# Patient Record
Sex: Male | Born: 1970 | Race: Black or African American | Hispanic: No | Smoking: Current some day smoker
Health system: Southern US, Community
[De-identification: ages and names within clinical notes are randomized; demographics above are authoritative.]

## PROBLEM LIST (undated history)

## (undated) DIAGNOSIS — I10 Essential (primary) hypertension: Secondary | ICD-10-CM

## (undated) DIAGNOSIS — I509 Heart failure, unspecified: Secondary | ICD-10-CM

## (undated) HISTORY — PX: ROTATOR CUFF REPAIR: SHX139

## (undated) HISTORY — PX: CHEST TUBE INSERTION: SHX231

---

## 2000-08-05 ENCOUNTER — Encounter: Payer: Self-pay | Admitting: Family Medicine

## 2000-08-05 ENCOUNTER — Encounter: Admission: RE | Admit: 2000-08-05 | Discharge: 2000-08-05 | Payer: Self-pay | Admitting: Family Medicine

## 2009-10-03 ENCOUNTER — Ambulatory Visit: Payer: Self-pay | Admitting: Internal Medicine

## 2009-10-03 DIAGNOSIS — J4489 Other specified chronic obstructive pulmonary disease: Secondary | ICD-10-CM | POA: Insufficient documentation

## 2009-10-03 DIAGNOSIS — J449 Chronic obstructive pulmonary disease, unspecified: Secondary | ICD-10-CM

## 2009-10-03 DIAGNOSIS — I10 Essential (primary) hypertension: Secondary | ICD-10-CM | POA: Insufficient documentation

## 2009-10-03 DIAGNOSIS — R0989 Other specified symptoms and signs involving the circulatory and respiratory systems: Secondary | ICD-10-CM

## 2009-10-03 DIAGNOSIS — R0609 Other forms of dyspnea: Secondary | ICD-10-CM | POA: Insufficient documentation

## 2009-10-10 ENCOUNTER — Encounter: Payer: Self-pay | Admitting: Internal Medicine

## 2009-10-15 LAB — CONVERTED CEMR LAB
BUN: 11 mg/dL (ref 6–23)
Basophils Absolute: 0 10*3/uL (ref 0.0–0.1)
Basophils Relative: 0.4 % (ref 0.0–3.0)
CO2: 29 meq/L (ref 19–32)
Calcium: 9.5 mg/dL (ref 8.4–10.5)
Chloride: 103 meq/L (ref 96–112)
Creatinine, Ser: 1.3 mg/dL (ref 0.4–1.5)
Eosinophils Absolute: 0.2 10*3/uL (ref 0.0–0.7)
Eosinophils Relative: 2.2 % (ref 0.0–5.0)
GFR calc non Af Amer: 78.15 mL/min (ref >60)
Glucose, Bld: 91 mg/dL (ref 70–99)
HCT: 42.6 % (ref 39.0–52.0)
Hemoglobin: 14.6 g/dL (ref 13.0–17.0)
Lymphocytes Relative: 20.8 % (ref 12.0–46.0)
Lymphs Abs: 2.3 10*3/uL (ref 0.7–4.0)
MCHC: 34.4 g/dL (ref 30.0–36.0)
MCV: 83.3 fL (ref 78.0–100.0)
Monocytes Absolute: 0.8 10*3/uL (ref 0.1–1.0)
Monocytes Relative: 7.5 % (ref 3.0–12.0)
Neutro Abs: 7.6 10*3/uL (ref 1.4–7.7)
Neutrophils Relative %: 69.1 % (ref 43.0–77.0)
Platelets: 261 10*3/uL (ref 150.0–400.0)
Potassium: 4 meq/L (ref 3.5–5.1)
Pro B Natriuretic peptide (BNP): 16 pg/mL (ref 0.0–100.0)
RBC: 5.12 M/uL (ref 4.22–5.81)
RDW: 13.6 % (ref 11.5–14.6)
Sodium: 141 meq/L (ref 135–145)
TSH: 1.43 microintl units/mL (ref 0.35–5.50)
WBC: 10.9 10*3/uL — ABNORMAL HIGH (ref 4.5–10.5)

## 2009-11-15 ENCOUNTER — Ambulatory Visit: Payer: Self-pay | Admitting: Internal Medicine

## 2009-11-16 ENCOUNTER — Telehealth: Payer: Self-pay | Admitting: Internal Medicine

## 2010-07-16 NOTE — Assessment & Plan Note (Signed)
Summary: Pulmonary consultation ? copd   Visit Type:  Initial Consult Copy to:  Dr. Renaye Rakers Primary Provider/Referring Provider:  Dr. Renaye Rakers  CC:  COPD.  History of Present Illness: 40 yobm smoker with doe x 1990's seen at Dr Tedra [place] request for ? COPD based office spirometry first week in April.  October 03, 2009 cc doe x indolent onset  mildly progressive x 15y but to point now where can't mow back yard and up [place] a problem and wakes up with congested cough most ams with minimal and transient sputum production. some better with albuterol.   Pt denies any significant sore throat, dysphagia, itching, sneezing,  nasal congestion or excess secretions,  fever, chills, sweats, unintended wt loss, pleuritic or exertional cp, hempoptysis,   orthopnea pnd or leg swelling Pt also denies any obvious fluctuation in symptoms with weather or environmental change or other alleviating or aggravating factors.       Current Medications (verified): 1)  Albuterol Sulfate (2.5 Mg/11ml) 0.083% Nebu (Albuterol Sulfate) .Marland Kitchen.. 1 Vial in Nebulizer Once Daily 2)  Tribenzor (? Strength) .Marland Kitchen.. 1 Once Daily  Allergies (verified): 1)  ! Pcn  Past History:  Past Medical History: Hypertension COPD by office spirometry April 2011     - PFT's ordered October 03, 2009   Family History: Negative for respiratory diseases or atopy   Social History: Married Current smoker since age 55.  Smokes 1/4 ppd. ETOH on the [employer]  Lives with Mother  Review of Systems       The patient complains of shortness of breath with activity, shortness of breath at rest, and productive cough.  The patient denies non-productive cough, coughing up blood, chest pain, irregular heartbeats, acid heartburn, indigestion, loss of appetite, weight change, abdominal pain, difficulty swallowing, sore throat, tooth/dental problems, headaches, nasal congestion/difficulty breathing through nose, sneezing, itching, ear ache, anxiety,  depression, hand/feet swelling, joint stiffness or pain, rash, change in color of mucus, and fever.    Vital Signs:  Patient profile:   40 year old male Height:      73 inches Weight:      243.25 pounds BMI:     32.21 O2 Sat:      95 % on Room air Temp:     97.9 degrees F oral Pulse rate:   95 / minute BP sitting:   122 / 78  (left arm)  Vitals Entered By: Vernie Murders (October 03, 2009 11:51 AM)  O2 Flow:  Room air  Physical Exam  Additional Exam:  amb pleasant bm nad wt 245 October 03, 2009 > 243 October 03, 2009  HEENT: nl dentition, turbinates, and orophanx. Nl external ear canals without cough reflex NECK :  without JVD/Nodes/TM/ nl carotid upstrokes bilaterally LUNGS: no acc muscle use, clear to A and P bilaterally without cough on insp or exp maneuvers CV:  RRR  no s3 or murmur or increase in P2, no edema  ABD:  soft and nontender with nl excursion in the supine position. No bruits or organomegaly, bowel sounds nl MS:  warm without deformities, calf tenderness, cyanosis or clubbing SKIN: warm and dry without lesions   NEURO:  alert, approp, no deficits     Sodium                    141 mEq/L                   135-145   Potassium  4.0 mEq/L                   3.5-5.1   Chloride                  103 mEq/L                   96-112   Carbon Dioxide            29 mEq/L                    19-32   Glucose                   91 mg/dL                    09-81   BUN                       11 mg/dL                    1-91   Creatinine                1.3 mg/dL                   4.7-8.2   Calcium                   9.5 mg/dL                   9.5-62.1   GFR                       78.15 mL/min                >60  Tests: (2) CBC Platelet w/Diff (CBCD)   White Cell Count     [H]  10.9 K/uL                   4.5-10.5   Red Cell Count            5.12 Mil/uL                 4.22-5.81   Hemoglobin                14.6 g/dL                   30.8-65.7   Hematocrit                 42.6 %                      39.0-52.0   MCV                       83.3 fl                     78.0-100.0   MCHC                      34.4 g/dL                   84.6-96.2   RDW                       13.6 %  11.5-14.6   Platelet Count            261.0 K/uL                  150.0-400.0   Neutrophil %              69.1 %                      43.0-77.0   Lymphocyte %              20.8 %                      12.0-46.0   Monocyte %                7.5 %                       3.0-12.0   Eosinophils%              2.2 %                       0.0-5.0   Basophils %               0.4 %                       0.0-3.0   Neutrophill Absolute      7.6 K/uL                    1.4-7.7   Lymphocyte Absolute       2.3 K/uL                    0.7-4.0   Monocyte Absolute         0.8 K/uL                    0.1-1.0  Eosinophils, Absolute                             0.2 K/uL                    0.0-0.7   Basophils Absolute        0.0 K/uL                    0.0-0.1  Tests: (3) TSH (TSH)   FastTSH                   1.43 uIU/mL                 0.35-5.50  Tests: (4) B-Type Natiuretic Peptide (BNPR)  B-Type Natriuetic Peptide                             16.0 pg/mL                  0.0-100.0  CXR  Procedure date:  10/03/2009  Findings:      Findings: Trachea is midline.  Heart size normal.  Mild biapical pleural thickening.  Lungs are otherwise clear.  Added density projecting over the posterior aspect of the lower thoracic spine, on the lateral view, is felt to represent superimposition of osseous structures, as there does not appear be a correlate on the  frontal view.  No pleural fluid.   IMPRESSION: No acute findings.    Impression & Recommendations:  Problem # 1:  COPD UNSPECIFIED (ICD-496) By initial office screen,  but clinically quite mild, needs  f/u pft's    DDX of  difficult airways managment all start with A and  include Adherence, Ace Inhibitors, Acid Reflux, Active  Sinus Disease, Alpha 1 Antitripsin deficiency, Anxiety masquerading as Airways dz,  ABPA,  allergy(esp in young), Aspiration (esp in elderly), Adverse effects of DPI,  Active smokers, plus one B  = Beta blocker use.Marland Kitchen    Active smoking appear to be the most likely suspect here....   I took this opportunity to educate the patient regarding the consequences of smoking in airway disorders based on all the data we have from the multiple national lung health studies indicating that smoking cessation, not choice of inhalers or physicians, is the most important aspect of care.    Medications Added to Medication List This Visit: 1)  Albuterol Sulfate (2.5 Mg/74ml) 0.083% Nebu (Albuterol sulfate) .Marland Kitchen.. 1 vial in nebulizer once daily 2)  Tribenzor (? Strength)  .Marland Kitchen.. 1 once daily  Other Orders: TLB-BMP (Basic Metabolic Panel-BMET) (80048-METABOL) TLB-CBC Platelet - w/Differential (85025-CBCD) TLB-TSH (Thyroid Stimulating Hormone) (84443-TSH) TLB-BNP (B-Natriuretic Peptide) (83880-BNPR) T-2 View CXR (71020TC) Consultation Level IV (04540)  Patient Instructions: 1)  Please schedule a follow-up appointment in 6 weeks, sooner if needed with PFT's on return 2)  Committ to quit smoking now

## 2010-07-16 NOTE — Progress Notes (Signed)
Summary: nos appt  Phone Note Call from Patient   Caller: [email or web address]  Call For: Zachary Davidson Summary of Call: LMTCB x2 to rsc nos from 6/2. Initial call taken by: Darletta Moll,  November 16, 2009 3:28 PM

## 2010-07-16 NOTE — Letter (Signed)
Summary: [facility] Results Follow Up Letter  [facility]  [address]   [place], [place] [address]   Phone: [phone]  Fax: [phone]    10/10/2009 MRN: [id number]  Zachary Davidson [address] [place], [place]  [address]  Dear Mr. CREQUE,  We have received the results from your recent tests and have been unable to contact you.  Please call our office at [phone] so that Dr. Sherene Sires or his nurse may review the results with you.    Thank you,  Nature conservation officer Pulmonary Division

## 2010-07-16 NOTE — Miscellaneous (Signed)
Summary: Orders Update  Clinical Lists Changes  Orders: Added new Service order of No Show NS50 (NS50) - Signed 

## 2010-08-07 ENCOUNTER — Emergency Department (HOSPITAL_COMMUNITY): Payer: PRIVATE HEALTH INSURANCE

## 2010-08-07 ENCOUNTER — Emergency Department (HOSPITAL_COMMUNITY)
Admission: EM | Admit: 2010-08-07 | Discharge: 2010-08-07 | Disposition: A | Discharge status: Home / Self Care | Payer: PRIVATE HEALTH INSURANCE | Attending: Emergency Medicine | Admitting: Emergency Medicine

## 2010-08-07 DIAGNOSIS — R0609 Other forms of dyspnea: Secondary | ICD-10-CM | POA: Insufficient documentation

## 2010-08-07 DIAGNOSIS — R5381 Other malaise: Secondary | ICD-10-CM | POA: Insufficient documentation

## 2010-08-07 DIAGNOSIS — R059 Cough, unspecified: Secondary | ICD-10-CM | POA: Insufficient documentation

## 2010-08-07 DIAGNOSIS — J189 Pneumonia, unspecified organism: Secondary | ICD-10-CM | POA: Insufficient documentation

## 2010-08-07 DIAGNOSIS — R0789 Other chest pain: Secondary | ICD-10-CM | POA: Insufficient documentation

## 2010-08-07 DIAGNOSIS — R0989 Other specified symptoms and signs involving the circulatory and respiratory systems: Secondary | ICD-10-CM | POA: Insufficient documentation

## 2010-08-07 DIAGNOSIS — R05 Cough: Secondary | ICD-10-CM | POA: Insufficient documentation

## 2010-08-07 DIAGNOSIS — IMO0001 Reserved for inherently not codable concepts without codable children: Secondary | ICD-10-CM | POA: Insufficient documentation

## 2010-08-14 ENCOUNTER — Emergency Department (HOSPITAL_COMMUNITY): Payer: PRIVATE HEALTH INSURANCE

## 2010-08-14 ENCOUNTER — Inpatient Hospital Stay (HOSPITAL_COMMUNITY)
Admission: EM | Admit: 2010-08-14 | Discharge: 2010-08-26 | DRG: 166 | Disposition: A | Discharge status: Home / Self Care | Payer: PRIVATE HEALTH INSURANCE | Attending: Internal Medicine | Admitting: Internal Medicine

## 2010-08-14 DIAGNOSIS — E871 Hypo-osmolality and hyponatremia: Secondary | ICD-10-CM | POA: Diagnosis present

## 2010-08-14 DIAGNOSIS — F17200 Nicotine dependence, unspecified, uncomplicated: Secondary | ICD-10-CM | POA: Diagnosis present

## 2010-08-14 DIAGNOSIS — J90 Pleural effusion, not elsewhere classified: Secondary | ICD-10-CM | POA: Diagnosis present

## 2010-08-14 DIAGNOSIS — I10 Essential (primary) hypertension: Secondary | ICD-10-CM | POA: Diagnosis present

## 2010-08-14 DIAGNOSIS — J9600 Acute respiratory failure, unspecified whether with hypoxia or hypercapnia: Secondary | ICD-10-CM | POA: Diagnosis present

## 2010-08-14 DIAGNOSIS — J189 Pneumonia, unspecified organism: Principal | ICD-10-CM | POA: Diagnosis present

## 2010-08-14 DIAGNOSIS — E119 Type 2 diabetes mellitus without complications: Secondary | ICD-10-CM | POA: Diagnosis present

## 2010-08-14 DIAGNOSIS — J869 Pyothorax without fistula: Secondary | ICD-10-CM | POA: Diagnosis present

## 2010-08-14 LAB — DIFFERENTIAL
Basophils Absolute: 0.1 10*3/uL (ref 0.0–0.1)
Basophils Absolute: 0 10*3/uL (ref 0.0–0.1)
Basophils Relative: 1 % (ref 0–1)
Basophils Relative: 0 % (ref 0–1)
Eosinophils Absolute: 0.3 10*3/uL (ref 0.0–0.7)
Eosinophils Absolute: 0.3 10*3/uL (ref 0.0–0.7)
Eosinophils Relative: 3 % (ref 0–5)
Lymphocytes Relative: 15 % (ref 12–46)
Lymphs Abs: 2.1 10*3/uL (ref 0.7–4.0)
Monocytes Absolute: 1.3 10*3/uL — ABNORMAL HIGH (ref 0.1–1.0)
Monocytes Relative: 9 % (ref 3–12)
Neutro Abs: 9.9 10*3/uL — ABNORMAL HIGH (ref 1.7–7.7)
Neutro Abs: 10.7 10*3/uL — ABNORMAL HIGH (ref 1.7–7.7)
Neutrophils Relative %: 72 % (ref 43–77)
Neutrophils Relative %: 78 % — ABNORMAL HIGH (ref 43–77)

## 2010-08-14 LAB — BASIC METABOLIC PANEL
BUN: 20 mg/dL (ref 6–23)
BUN: 20 mg/dL (ref 6–23)
Calcium: 8.6 mg/dL (ref 8.4–10.5)
Creatinine, Ser: 1.26 mg/dL (ref 0.4–1.5)
GFR calc Af Amer: >60 mL/min (ref >60)
GFR calc non Af Amer: 59 mL/min — ABNORMAL LOW (ref >60)
GFR calc non Af Amer: >60 mL/min (ref >60)
Glucose, Bld: 159 mg/dL — ABNORMAL HIGH (ref 70–99)
Potassium: 3.7 mEq/L (ref 3.5–5.1)
Sodium: 129 mEq/L — ABNORMAL LOW (ref 135–145)

## 2010-08-14 LAB — CBC
HCT: 42.9 % (ref 39.0–52.0)
Hemoglobin: 14.5 g/dL (ref 13.0–17.0)
Hemoglobin: 13.8 g/dL (ref 13.0–17.0)
MCH: 27.4 pg (ref 26.0–34.0)
MCHC: 33.8 g/dL (ref 30.0–36.0)
MCV: 80.9 fL (ref 78.0–100.0)
Platelets: 259 10*3/uL (ref 150–400)
Platelets: 256 10*3/uL (ref 150–400)
RBC: 5.3 MIL/uL (ref 4.22–5.81)
RBC: 5.13 MIL/uL (ref 4.22–5.81)
RDW: 13 % (ref 11.5–15.5)
WBC: 13.7 10*3/uL — ABNORMAL HIGH (ref 4.0–10.5)
WBC: 13.8 10*3/uL — ABNORMAL HIGH (ref 4.0–10.5)

## 2010-08-15 ENCOUNTER — Inpatient Hospital Stay (HOSPITAL_COMMUNITY): Payer: PRIVATE HEALTH INSURANCE

## 2010-08-15 DIAGNOSIS — J189 Pneumonia, unspecified organism: Secondary | ICD-10-CM

## 2010-08-15 DIAGNOSIS — J869 Pyothorax without fistula: Secondary | ICD-10-CM

## 2010-08-15 DIAGNOSIS — J90 Pleural effusion, not elsewhere classified: Secondary | ICD-10-CM

## 2010-08-15 DIAGNOSIS — R0902 Hypoxemia: Secondary | ICD-10-CM

## 2010-08-15 LAB — BODY FLUID CELL COUNT WITH DIFFERENTIAL: Total Nucleated Cell Count, Fluid: 13904 cu mm — ABNORMAL HIGH (ref 0–1000)

## 2010-08-15 LAB — DIFFERENTIAL
Eosinophils Absolute: 0.3 10*3/uL (ref 0.0–0.7)
Lymphocytes Relative: 14 % (ref 12–46)
Lymphs Abs: 1.8 10*3/uL (ref 0.7–4.0)
Neutro Abs: 9.3 10*3/uL — ABNORMAL HIGH (ref 1.7–7.7)
Neutrophils Relative %: 72 % (ref 43–77)

## 2010-08-15 LAB — CBC
Hemoglobin: 13.5 g/dL (ref 13.0–17.0)
MCV: 81.1 fL (ref 78.0–100.0)
Platelets: 236 10*3/uL (ref 150–400)
RBC: 5.03 MIL/uL (ref 4.22–5.81)
WBC: 12.9 10*3/uL — ABNORMAL HIGH (ref 4.0–10.5)

## 2010-08-15 LAB — BASIC METABOLIC PANEL
BUN: 9 mg/dL (ref 6–23)
CO2: 25 mEq/L (ref 19–32)
Chloride: 106 mEq/L (ref 96–112)
Potassium: 3.8 mEq/L (ref 3.5–5.1)

## 2010-08-15 LAB — EXPECTORATED SPUTUM ASSESSMENT W GRAM STAIN, RFLX TO RESP C

## 2010-08-15 LAB — LACTATE DEHYDROGENASE, PLEURAL OR PERITONEAL FLUID

## 2010-08-15 LAB — GLUCOSE, CAPILLARY: Glucose-Capillary: 138 mg/dL — ABNORMAL HIGH (ref 70–99)

## 2010-08-15 LAB — PROTEIN, BODY FLUID: Total protein, fluid: 4.8 g/dL

## 2010-08-16 ENCOUNTER — Other Ambulatory Visit: Payer: Self-pay | Admitting: Thoracic Surgery (Cardiothoracic Vascular Surgery)

## 2010-08-16 ENCOUNTER — Inpatient Hospital Stay (HOSPITAL_COMMUNITY): Payer: PRIVATE HEALTH INSURANCE

## 2010-08-16 DIAGNOSIS — J869 Pyothorax without fistula: Secondary | ICD-10-CM

## 2010-08-16 LAB — GLUCOSE, CAPILLARY: Glucose-Capillary: 131 mg/dL — ABNORMAL HIGH (ref 70–99)

## 2010-08-16 LAB — PROTIME-INR: Prothrombin Time: 15.2 seconds (ref 11.6–15.2)

## 2010-08-16 LAB — PHOSPHORUS: Phosphorus: 3.5 mg/dL (ref 2.3–4.6)

## 2010-08-16 LAB — CBC
MCH: 27.5 pg (ref 26.0–34.0)
MCHC: 33.8 g/dL (ref 30.0–36.0)
Platelets: 248 10*3/uL (ref 150–400)
RDW: 13.3 % (ref 11.5–15.5)

## 2010-08-16 LAB — COMPREHENSIVE METABOLIC PANEL
AST: 24 U/L (ref 0–37)
Albumin: 3 g/dL — ABNORMAL LOW (ref 3.5–5.2)
Chloride: 101 mEq/L (ref 96–112)
Creatinine, Ser: 1.12 mg/dL (ref 0.4–1.5)
GFR calc Af Amer: >60 mL/min (ref >60)
Potassium: 3.8 mEq/L (ref 3.5–5.1)
Sodium: 135 mEq/L (ref 135–145)
Total Bilirubin: 1.3 mg/dL — ABNORMAL HIGH (ref 0.3–1.2)

## 2010-08-16 LAB — DIFFERENTIAL
Basophils Absolute: 0.1 10*3/uL (ref 0.0–0.1)
Eosinophils Absolute: 0.3 10*3/uL (ref 0.0–0.7)
Eosinophils Relative: 2 % (ref 0–5)

## 2010-08-16 NOTE — Consult Note (Signed)
NAMEJARIN, CORNFIELD                  ACCOUNT NO.:  [id number]  MEDICAL RECORD NO.:  [id number]           PATIENT TYPE:  I  LOCATION:  2907                         FACILITY:  [facility]  PHYSICIAN:  Salvatore Decent. Cornelius Moras, M.D. DATE OF BIRTH:  [birth year]  DATE OF CONSULTATION:  08/15/2010 DATE OF DISCHARGE:                                CONSULTATION   REQUESTING PHYSICIAN:  Felipa Evener, MD  PRIMARY CARE PHYSICIAN:  Renaye Rakers, MD  REASON FOR CONSULTATION:  Possible empyema.  HISTORY OF PRESENT ILLNESS:  Zachary Davidson is a 40 year old African American male with history of hypertension and recently discovered type 2 diabetes mellitus.  The patient also has history of severe pneumonia approximately 3-4 years ago.  The patient first developed shortness of breath and cough approximately 2 weeks ago.  He was seen in the emergency apartment on August 07, 2010, and treated for possible pneumonia with amoxicillin.  He initially had some improvement, but his symptoms worsened and became associated with more shortness of breath. The patient subsequently returned to the hospital and was admitted to the hospital on August 14, 2010.  He has been started on IV antibiotics.  Chest x-ray demonstrates pneumonia with parapneumonic effusion.  Thoracentesis was performed with 500 mL of fluid.  There was incomplete re-expansion of the lung.  Follow up chest CT scan demonstrates significant residual pleural effusion with severe consolidation of the right lung.  Cardiothoracic surgical consultation was requested.  REVIEW OF SYSTEMS:  The patient reports normal appetite prior to his acute illness.  The patient describes shortness of breath with pleuritic right-sided chest pain, then is worse with cough.  The patient has had a cough productive of sputum.  He denies hemoptysis.  He denies any exertional chest pain.  Denies any abdominal pain.  Bowel function has been regular.  MUSCULOSKELETAL:  Negative.   NEUROLOGIC:  Negative.  PAST MEDICAL HISTORY: 1. Hypertension. 2. Type 2 diabetes mellitus, recently diagnosed. 3. Pneumonia.  FAMILY HISTORY:  Noncontributory.  SOCIAL HISTORY:  The patient works as a Paediatric nurse.  He has a history of tobacco abuse.  The patient also has used cocaine in the past as well as smoked marijuana.  He denies excessive alcohol abuse.  MEDICATIONS PRIOR TO ADMISSION:  Include amoxicillin, Ventolin inhaler, Exforge, Glumetza.  DRUG ALLERGIES:  None known.  PHYSICAL EXAMINATION:  GENERAL:  The patient is well appearing mildly obese [place] American male who is in no acute distress. HEENT:  Unrevealing. VITAL SIGNS:  Temperature is 36.9 degrees centigrade. HEART:  Auscultation of the chest reveals diminished breath sounds on the right side.  No wheezes or rhonchi noted.  CARDIOVASCULAR:  Notable for regular rate and rhythm.  No murmurs, rubs, or gallops are noted. ABDOMEN:  Soft, nontender. EXTREMITIES:  Warm and well perfused.  There is no lower extremity edema.  The remainder of his physical exam is noncontributory.  LABORATORY DATA:  Admission blood count notable for white blood count of 13,700.  Chest x-ray revealed opacity in the right side with pleural effusion.  Chest CT scan confirms the presence of severe consolidation on the  right middle lobe and right lower lobe with a moderate-sized residual right pleural effusion consistent with pneumonia and probable empyema.  IMPRESSION:  Severe pneumonia with acute empyema.  I think, Zachary Davidson, would best be treated with thoracoscopic drainage.  PLAN:  I have discussed matters at length with Zachary Davidson.  Alternative treatment strategies have been discussed.  We have planned to proceed with surgery tomorrow for right video-assisted thoracoscopy for drainage of empyema.  He understands and accepts all potential associated risks of surgery and desires to proceed as described.     Salvatore Decent. Cornelius Moras,  M.D.     CHO/MEDQ  D:  08/15/2010  T:  08/16/2010  Job:  [id number]  cc:   Felipa Evener, MD Renaye Rakers, M.D.  Electronically Signed by Tressie Stalker M.D. on 08/16/2010 06:57:20 AM

## 2010-08-17 ENCOUNTER — Inpatient Hospital Stay (HOSPITAL_COMMUNITY): Payer: PRIVATE HEALTH INSURANCE

## 2010-08-17 LAB — GLUCOSE, CAPILLARY
Glucose-Capillary: 111 mg/dL — ABNORMAL HIGH (ref 70–99)
Glucose-Capillary: 127 mg/dL — ABNORMAL HIGH (ref 70–99)
Glucose-Capillary: 148 mg/dL — ABNORMAL HIGH (ref 70–99)
Glucose-Capillary: 168 mg/dL — ABNORMAL HIGH (ref 70–99)

## 2010-08-17 LAB — POCT I-STAT 3, ART BLOOD GAS (G3+)
Acid-Base Excess: 1 mmol/L (ref 0.0–2.0)
O2 Saturation: 87 %

## 2010-08-17 LAB — RENAL FUNCTION PANEL
Albumin: 2.7 g/dL — ABNORMAL LOW (ref 3.5–5.2)
BUN: 7 mg/dL (ref 6–23)
Calcium: 8.6 mg/dL (ref 8.4–10.5)
Phosphorus: 4 mg/dL (ref 2.3–4.6)
Potassium: 4.1 mEq/L (ref 3.5–5.1)
Sodium: 137 mEq/L (ref 135–145)

## 2010-08-17 LAB — CBC
MCHC: 33.4 g/dL (ref 30.0–36.0)
MCV: 82.1 fL (ref 78.0–100.0)
Platelets: 289 10*3/uL (ref 150–400)
RDW: 13.3 % (ref 11.5–15.5)
WBC: 17 10*3/uL — ABNORMAL HIGH (ref 4.0–10.5)

## 2010-08-17 LAB — DIFFERENTIAL
Basophils Absolute: 0 10*3/uL (ref 0.0–0.1)
Eosinophils Absolute: 0.1 10*3/uL (ref 0.0–0.7)
Eosinophils Relative: 1 % (ref 0–5)

## 2010-08-17 NOTE — Op Note (Signed)
Zachary Davidson, Zachary Davidson                  ACCOUNT NO.:  [id number]  MEDICAL RECORD NO.:  [id number]           PATIENT TYPE:  I  LOCATION:  2305                         FACILITY:  [facility]  PHYSICIAN:  Salvatore Decent. Cornelius Moras, M.D. DATE OF BIRTH:  02/23/1971  DATE OF PROCEDURE:  08/16/2010 DATE OF DISCHARGE:                              OPERATIVE REPORT   PREOPERATIVE DIAGNOSIS:  Right empyema.  POSTOPERATIVE DIAGNOSIS:  Right empyema.  PROCEDURE:  Right video-assisted thoracoscopy for drainage of empyema.  BRIEF CLINICAL NOTE:  The patient is a 40 year old African American male with hypertension and type 2 diabetes mellitus, admitted to the hospital with pneumonia.  Chest x-ray demonstrates pleural effusion. Thoracentesis yields fluid with exudative properties.  Followup CT scan demonstrates significant residual pleural effusion with dense consolidation of the right middle lobe and right lower lobe.  The patient has been counseled regarding the indications, risks, and potential benefits of surgical drainage.  All these questions have been addressed.  He understands and accepts all associated risks and desires to proceed as described.  OPERATIVE NOTE IN DETAIL:  Following full informed consent, the patient was brought to the operating room on above-mentioned date and placed in the supine position on the operating table.  The patient is already receiving intravenous antibiotics.  Time-out procedure was performed. The patient is intubated with a dual-lumen endotracheal tube.  A Foley catheter was placed.  The patient is turned to the left lateral decubitus position using a pneumatic beanbag device and axillary roll to facilitate positioning.  The right chest was prepared and draped in sterile manner.  Single lung ventilation has begun.  A small incision was made overlying the sixth intercostal space in the anterior axillary line.  The incision was completed through the subcutaneous tissues  with electrocautery.  The right pleural space was entered bluntly.  A suction catheter was inserted into the pleural space and pleural fluid was evacuated and sent for cytology, culture, and a variety of laboratory tests.  A 10-mm port was passed through the incision and the right chest was explored visually.  There was dense acute suppurative inflammation consistent with acute empyema and loculated adhesions throughout the space.  Two additional ports incisions are placed including 1 posteriorly through the fifth intercostal space and 1 anterior leaf through the fourth intercostal space.  Using these incisions, the lung was mobilized in its entirety circumferentially.  Loculations are within and easy to breakup.  Pockets of fluid are evacuated.  The chest was irrigated with a total of 2 L of warm saline solution.  The chest was drained with a 36-French right-angle chest tube placed through original port incision.  The 2 remaining port incisions are closed in layers and skin incisions are closed with subcuticular skin closure.  The patient tolerated this procedure well. The patient was extubated in the operating room.  The patient was transported directly to our hospital room in stable condition.  There are no intraoperative complications.     Salvatore Decent. Cornelius Moras, M.D.     CHO/MEDQ  D:  08/16/2010  T:  08/17/2010  Job:  [id number]  cc:   Felipa Evener, MD Renaye Rakers, M.D.  Electronically Signed by Tressie Stalker M.D. on 08/17/2010 09:19:49 AM

## 2010-08-18 ENCOUNTER — Inpatient Hospital Stay (HOSPITAL_COMMUNITY): Payer: PRIVATE HEALTH INSURANCE

## 2010-08-18 ENCOUNTER — Other Ambulatory Visit: Payer: Self-pay | Admitting: Internal Medicine

## 2010-08-18 LAB — COMPREHENSIVE METABOLIC PANEL
ALT: 40 U/L (ref 0–53)
AST: 43 U/L — ABNORMAL HIGH (ref 0–37)
CO2: 27 mEq/L (ref 19–32)
Calcium: 8.7 mg/dL (ref 8.4–10.5)
GFR calc Af Amer: 47 mL/min — ABNORMAL LOW (ref >60)
GFR calc non Af Amer: 39 mL/min — ABNORMAL LOW (ref >60)
Sodium: 135 mEq/L (ref 135–145)

## 2010-08-18 LAB — CROSSMATCH: Unit division: 0

## 2010-08-18 LAB — BASIC METABOLIC PANEL
CO2: 27 mEq/L (ref 19–32)
Calcium: 8.3 mg/dL — ABNORMAL LOW (ref 8.4–10.5)
GFR calc Af Amer: >60 mL/min (ref >60)
GFR calc non Af Amer: >60 mL/min (ref >60)
Sodium: 137 mEq/L (ref 135–145)

## 2010-08-18 LAB — GLUCOSE, CAPILLARY: Glucose-Capillary: 116 mg/dL — ABNORMAL HIGH (ref 70–99)

## 2010-08-18 LAB — CBC
Hemoglobin: 12.3 g/dL — ABNORMAL LOW (ref 13.0–17.0)
MCHC: 33.4 g/dL (ref 30.0–36.0)
RBC: 4.48 MIL/uL (ref 4.22–5.81)

## 2010-08-18 LAB — CULTURE, RESPIRATORY W GRAM STAIN: Culture: NORMAL

## 2010-08-19 ENCOUNTER — Inpatient Hospital Stay (HOSPITAL_COMMUNITY): Payer: PRIVATE HEALTH INSURANCE

## 2010-08-19 LAB — BASIC METABOLIC PANEL
Calcium: 8.4 mg/dL (ref 8.4–10.5)
Creatinine, Ser: 1.03 mg/dL (ref 0.4–1.5)
GFR calc Af Amer: >60 mL/min (ref >60)
GFR calc non Af Amer: >60 mL/min (ref >60)
Sodium: 139 mEq/L (ref 135–145)

## 2010-08-19 LAB — RENAL FUNCTION PANEL
Albumin: 2.5 g/dL — ABNORMAL LOW (ref 3.5–5.2)
BUN: 5 mg/dL — ABNORMAL LOW (ref 6–23)
Calcium: 8.7 mg/dL (ref 8.4–10.5)
Phosphorus: 2.6 mg/dL (ref 2.3–4.6)
Potassium: 4 mEq/L (ref 3.5–5.1)
Sodium: 138 mEq/L (ref 135–145)

## 2010-08-19 LAB — BODY FLUID CULTURE

## 2010-08-19 LAB — CBC
MCHC: 32.6 g/dL (ref 30.0–36.0)
MCHC: 33.8 g/dL (ref 30.0–36.0)
Platelets: 261 10*3/uL (ref 150–400)
Platelets: 271 10*3/uL (ref 150–400)
RDW: 13.3 % (ref 11.5–15.5)
RDW: 13.3 % (ref 11.5–15.5)
WBC: 12 10*3/uL — ABNORMAL HIGH (ref 4.0–10.5)

## 2010-08-19 LAB — GLUCOSE, CAPILLARY: Glucose-Capillary: 124 mg/dL — ABNORMAL HIGH (ref 70–99)

## 2010-08-19 LAB — PHOSPHORUS: Phosphorus: 2.9 mg/dL (ref 2.3–4.6)

## 2010-08-19 NOTE — H&P (Signed)
Zachary Davidson, Zachary Davidson                  ACCOUNT NO.:  [id number]  MEDICAL RECORD NO.:  [id number]           PATIENT TYPE:  E  LOCATION:  [facility]                         FACILITY:  [facility]  PHYSICIAN:  Della Goo, M.D. DATE OF BIRTH:  01/07/1971  DATE OF ADMISSION:  08/14/2010 DATE OF DISCHARGE:                             HISTORY & PHYSICAL   ADMISSION DATE:  August 14, 2010.  PRIMARY CARE PHYSICIAN:  Renaye Rakers, MD  CHIEF COMPLAINT:  Shortness of breath.  HISTORY OF PRESENT ILLNESS:  This is a 40 year old male who returns to the Emergency department at [facility] secondary to worsening shortness of breath and chest congestion and discomfort.  He had been diagnosed 1 week ago with pneumonia when he was seen in the emergency department.  At that time, he had been placed on amoxicillin therapy and discharged to home.  The patient states he felt better,  however worsened 2 days ago with worsening cough which has been producing clear thick mucus.  He has also had pleurisy in the right lower chest and right back area.  The patient reports having fever, chills.  He also reports having sweats.  He also has had poor appetite.  Denies having any nausea, vomiting, or diarrhea.  The patient was seen in the emergency department and reevaluated.  A chest x-ray was performed which revealed worsening of the right lower lobe pneumonia.  The patient was placed on IV Avelox therapy and referred for medical admission.  Supplemental oxygen was also ordered.  PAST MEDICAL HISTORY:  Significant for hypertension and type 2 diabetes mellitus.  His medications include amoxicillin and Ventolin which were prescribed 1 week ago, Exforge 10/320 one p.o. daily, and Glumetza 500 mg.  ALLERGIES:  No known drug allergies.  SOCIAL HISTORY:  The patient is a smoker, who smokes three cigarettes a day.  He reports drinking occasional alcohol and he reports no marijuana usage or illicit drug  usage.  FAMILY HISTORY:  Positive for hypertension and coronary artery disease and prostate cancer in his father and positive diabetes in his mother.  REVIEW OF SYSTEMS:  Pertinent as mentioned above.  PHYSICAL EXAMINATION FINDINGS:  GENERAL:  This is a 40 year old overweight, but well developed African American male who is in discomfort, but no acute distress.  VITAL SIGNS:  Temperature 98.1, blood pressure 126/83, heart rate 98-104, respirations 24-25, and O2 saturations 94-95%.  HEENT EXAMINATION:  Normocephalic, atraumatic. Pupils equal, round, and reactive to light.  Extraocular movements are intact. Funduscopic benign.  Nares are patent bilaterally.  Oropharynx is clear. NECK:  Supple.  Full range of motion.  No thyromegaly, adenopathy, jugular venous distention. CARDIOVASCULAR:  Regular rate and rhythm.  No murmurs, gallops, or rubs. LUNGS:  With decreased rhonchorous breath sounds.  No rales and no wheezes appreciated. ABDOMEN:  Positive bowel sounds, soft, nontender, nondistended.  No hepatosplenomegaly.  No rebound, no guarding.  EXTREMITIES:  Without cyanosis, clubbing, or edema. NEUROLOGIC EXAMINATION:  The patient is alert and oriented x3.  Cranial nerves are intact.  Motor and sensory function are also intact.  LABORATORY STUDIES:  White blood  cell count 13.7, hemoglobin 14.5, hematocrit 42.9, MCV 80.9, and MCH 27.4, platelets 259, neutrophils 72%, lymphocytes 15%.  Sodium 129, potassium 3.7, chloride 100, carbon dioxide 20, BUN 20, creatinine 1.32, and glucose 159.  Chest x-ray as mentioned above reveals significant interval worsening of the right lower lobe.  ASSESSMENT:  A 41 year old male being admitted with: 1. Community-acquired pneumonia. 2. Hypertension. 3. Type 2 diabetes mellitus. 4. Mild hyponatremia. 5. Mild hyperglycemia.  PLAN:  The patient will be admitted.  Blood cultures x2 have been ordered and patient will be placed on IV Avelox therapy to  cover community-acquired pneumonia at this time.  Nebulizer treatments have been ordered and supplemental oxygen.  DVT prophylaxis has been ordered and patient's regular medications will be further verified.  The patient will be placed on medications for his symptoms with Mucinex and Tussionex p.r.n. cough.  Gentle IV fluids have been ordered for fluid resuscitation.  The patient is a full code.     Della Goo, M.D.     HJ/MEDQ  D:  08/14/2010  T:  08/14/2010  Job:  [id number]  cc:   Renaye Rakers, M.D.  Electronically Signed by Della Goo M.D. on 08/19/2010 08:32:15 PM

## 2010-08-20 ENCOUNTER — Inpatient Hospital Stay (HOSPITAL_COMMUNITY): Payer: PRIVATE HEALTH INSURANCE

## 2010-08-20 LAB — GLUCOSE, CAPILLARY: Glucose-Capillary: 133 mg/dL — ABNORMAL HIGH (ref 70–99)

## 2010-08-20 LAB — BODY FLUID CULTURE

## 2010-08-21 ENCOUNTER — Inpatient Hospital Stay (HOSPITAL_COMMUNITY): Payer: PRIVATE HEALTH INSURANCE

## 2010-08-21 LAB — CULTURE, BLOOD (ROUTINE X 2): Culture: NO GROWTH

## 2010-08-21 LAB — CBC
HCT: 34.8 % — ABNORMAL LOW (ref 39.0–52.0)
Hemoglobin: 11.5 g/dL — ABNORMAL LOW (ref 13.0–17.0)
MCH: 26.8 pg (ref 26.0–34.0)
MCHC: 33 g/dL (ref 30.0–36.0)
MCV: 81.1 fL (ref 78.0–100.0)
Platelets: 317 10*3/uL (ref 150–400)
RBC: 4.29 MIL/uL (ref 4.22–5.81)
RDW: 13.4 % (ref 11.5–15.5)
WBC: 10.5 10*3/uL (ref 4.0–10.5)

## 2010-08-21 LAB — BASIC METABOLIC PANEL
BUN: 5 mg/dL — ABNORMAL LOW (ref 6–23)
CO2: 29 mEq/L (ref 19–32)
Calcium: 8.4 mg/dL (ref 8.4–10.5)
Chloride: 93 mEq/L — ABNORMAL LOW (ref 96–112)
Creatinine, Ser: 0.97 mg/dL (ref 0.4–1.5)
GFR calc Af Amer: >60 mL/min (ref >60)
GFR calc non Af Amer: >60 mL/min (ref >60)
Glucose, Bld: 146 mg/dL — ABNORMAL HIGH (ref 70–99)
Potassium: 3.5 mEq/L (ref 3.5–5.1)
Sodium: 139 mEq/L (ref 135–145)

## 2010-08-21 MED ORDER — IOHEXOL 300 MG/ML  SOLN
100.0000 mL | Freq: Once | INTRAMUSCULAR | Status: AC | PRN
  Administered 2010-08-21: 100 mL via INTRAVENOUS

## 2010-08-22 LAB — GLUCOSE, CAPILLARY
Glucose-Capillary: 159 mg/dL — ABNORMAL HIGH (ref 70–99)
Glucose-Capillary: 138 mg/dL — ABNORMAL HIGH (ref 70–99)
Glucose-Capillary: 124 mg/dL — ABNORMAL HIGH (ref 70–99)
Glucose-Capillary: 109 mg/dL — ABNORMAL HIGH (ref 70–99)

## 2010-08-23 ENCOUNTER — Inpatient Hospital Stay (HOSPITAL_COMMUNITY): Payer: PRIVATE HEALTH INSURANCE

## 2010-08-23 LAB — CBC
Hemoglobin: 11.9 g/dL — ABNORMAL LOW (ref 13.0–17.0)
Platelets: 370 10*3/uL (ref 150–400)
RBC: 4.52 MIL/uL (ref 4.22–5.81)
WBC: 9.9 10*3/uL (ref 4.0–10.5)

## 2010-08-23 LAB — DIFFERENTIAL
Basophils Absolute: 0.1 10*3/uL (ref 0.0–0.1)
Basophils Relative: 1 % (ref 0–1)
Eosinophils Absolute: 0.5 10*3/uL (ref 0.0–0.7)
Lymphocytes Relative: 22 % (ref 12–46)
Monocytes Relative: 10 % (ref 3–12)
Neutrophils Relative %: 62 % (ref 43–77)

## 2010-08-23 LAB — BASIC METABOLIC PANEL
Calcium: 8.8 mg/dL (ref 8.4–10.5)
Chloride: 101 mEq/L (ref 96–112)
Creatinine, Ser: 1.1 mg/dL (ref 0.4–1.5)
GFR calc Af Amer: >60 mL/min (ref >60)
Sodium: 136 mEq/L (ref 135–145)

## 2010-08-24 ENCOUNTER — Inpatient Hospital Stay (HOSPITAL_COMMUNITY): Payer: PRIVATE HEALTH INSURANCE

## 2010-08-24 LAB — GLUCOSE, CAPILLARY: Glucose-Capillary: 77 mg/dL (ref 70–99)

## 2010-08-25 LAB — GLUCOSE, CAPILLARY
Glucose-Capillary: 97 mg/dL (ref 70–99)
Glucose-Capillary: 87 mg/dL (ref 70–99)
Glucose-Capillary: 101 mg/dL — ABNORMAL HIGH (ref 70–99)
Glucose-Capillary: 128 mg/dL — ABNORMAL HIGH (ref 70–99)
Glucose-Capillary: 104 mg/dL — ABNORMAL HIGH (ref 70–99)
Glucose-Capillary: 126 mg/dL — ABNORMAL HIGH (ref 70–99)

## 2010-08-26 ENCOUNTER — Inpatient Hospital Stay (HOSPITAL_COMMUNITY): Payer: PRIVATE HEALTH INSURANCE

## 2010-08-26 LAB — GLUCOSE, CAPILLARY
Glucose-Capillary: 112 mg/dL — ABNORMAL HIGH (ref 70–99)
Glucose-Capillary: 99 mg/dL (ref 70–99)

## 2010-08-26 LAB — CBC
HCT: 36.1 % — ABNORMAL LOW (ref 39.0–52.0)
Hemoglobin: 12.1 g/dL — ABNORMAL LOW (ref 13.0–17.0)
MCH: 27 pg (ref 26.0–34.0)
RBC: 4.48 MIL/uL (ref 4.22–5.81)

## 2010-08-28 LAB — GLUCOSE, CAPILLARY
Glucose-Capillary: 108 mg/dL — ABNORMAL HIGH (ref 70–99)
Glucose-Capillary: 119 mg/dL — ABNORMAL HIGH (ref 70–99)

## 2010-09-01 ENCOUNTER — Emergency Department (HOSPITAL_COMMUNITY): Payer: PRIVATE HEALTH INSURANCE

## 2010-09-01 ENCOUNTER — Emergency Department (HOSPITAL_COMMUNITY)
Admission: EM | Admit: 2010-09-01 | Discharge: 2010-09-01 | Disposition: A | Discharge status: Home / Self Care | Payer: PRIVATE HEALTH INSURANCE | Attending: Emergency Medicine | Admitting: Emergency Medicine

## 2010-09-01 DIAGNOSIS — G4700 Insomnia, unspecified: Secondary | ICD-10-CM | POA: Insufficient documentation

## 2010-09-01 DIAGNOSIS — E119 Type 2 diabetes mellitus without complications: Secondary | ICD-10-CM | POA: Insufficient documentation

## 2010-09-01 DIAGNOSIS — J90 Pleural effusion, not elsewhere classified: Secondary | ICD-10-CM | POA: Insufficient documentation

## 2010-09-01 DIAGNOSIS — I10 Essential (primary) hypertension: Secondary | ICD-10-CM | POA: Insufficient documentation

## 2010-09-10 NOTE — Discharge Summary (Signed)
Zachary Davidson, SOX NO.:  [id number]  MEDICAL RECORD NO.:  [id number]           PATIENT TYPE:  I  LOCATION:  3301                         FACILITY:  [facility]  PHYSICIAN:  Lonia Blood, M.D.DATE OF BIRTH:  1971/01/30  DATE OF ADMISSION:  08/14/2010 DATE OF DISCHARGE:  08/26/2010                              DISCHARGE SUMMARY   ADMITTING PHYSICIAN:  Della Goo, MD with [facility].  ATTENDING PHYSICIAN:  Today is Lonia Blood, MD with [facility].  DISCHARGING PHYSICIAN:  Lonia Blood, MD  CONSULTANTS:  On this admission, Salvatore Decent. Cornelius Moras, MD with Thoracic Surgery and Dr. Molli Knock with Pulmonary Critical Care Medicine.  CHIEF COMPLAINT/REASON FOR ADMISSION:  Mr. Zachary Davidson is a very pleasant 40- year-old male patient with history of diabetes on oral hypoglycemic agents and hypertension on 3 agents who presented to the hospital with progressive shortness of breath, chest congestion and pleuritic chest pain for 1 week.  He had been diagnosed with pneumonia and was treated with amoxicillin.  This was diagnosed in the emergency department.  He felt better for several days, but then 2 days prior to representing to the hospital on the date of admission, he developed worsening cough with thick mucous and severe pleurisy in the right lower chest and back area. He also endorsed having fevers and chills and nocturnal diaphoresis.  He also had anorexia.  X-ray performed at admission showed worsening of the right lower lobe pneumonia.  He was requiring supplemental oxygen and was started empirically on IV Avelox therapy by the emergency department.  On clinical exam, the patient was in significant discomfort, but in no acute distress.  Vital signs revealed temperature of 98.1, BP 126/83, heart rate 98-104, respirations 24-25, O2 saturation is 94-95%. Pulmonary exam revealed decreased rhonchorous breath sounds throughout. No rales.  No  wheezes appreciated.  Some splinting with respiratory effort, otherwise physical exam was unremarkable.  His white blood cell count was 13,700, hemoglobin 14.5, hematocrit 43, MCV 81, platelets 259,000, neutrophils 72%, lymphocytes 15%.  Sodium 129, potassium 3.7, chloride 100, CO2 of 20, BUN 20, creatinine 1.32, glucose 159.  PAST MEDICAL HISTORY: 1. Hypertension. 2. Type 2 diabetes. 3. Ongoing tobacco abuse, 3 cigarettes per days.  ADMITTING DIAGNOSES: 1. Acute hypoxemic respiratory failure related to community-acquired     pneumonia. 2. Pleuritic chest pain. 3. Hypertension. 4. Diabetes with mild hyperglycemia. 5. Mild hyponatremia, most likely related to acute pulmonary infection     and dehydration.  PROCEDURES:  Right video-assisted thoracoscopy for drainage of empyema on August 17, 2010 by Dr. Tressie Stalker.  DIAGNOSTICS: 1. Two-view chest x-ray on August 14, 2010 shows significant     interval worsening in right lower lobe pneumonia as compared to     chest x-ray August 07, 2010. 2. Two-view chest x-ray August 15, 2010 that shows worsening right basal     infiltrate consistent with pneumonia and enlarging parapneumonic     effusion, consider thoracentesis. 3. Portable chest x-ray August 15, 2010 shows no evidence of     pneumothorax following right thoracentesis.  Portable CT of the  chest without contrast August 15, 2010 that shows right middle and     lower lobe consolidation and volume loss with small-to-moderate     right pleural effusion.  This is suspicious for pneumonia.     Clinical correlation and continued radiographic followup     recommended.  Bronchoscopy should also be considered for further     evaluation.  In addition, he was found to have mild mediastinal     adenopathy which is nonspecific, but may be reactive.  Right hilar     adenopathy cannot be excluded on the noncontrast study.  Portable     chest x-ray August 16, 2010 showed no change.  Portable  chest x-ray,     August 17, 2010, postoperatively, shows stable right-sided chest tube     with no pneumothorax, persistent right effusion and overlying     atelectasis, overall slight improvement in lung aeration. 4. Portable chest x-ray, on August 18, 2010 that showed overall stable     chest x-ray findings. 5. Portable chest x-ray August 19, 2010 that shows no significant     change. 6. Ultrasound-guided thoracentesis shows successful ultrasound guided     diagnostic and therapeutic right thoracentesis yielding 530 mL of     pleural fluid. 7. Portable chest x-ray, August 19, 2010 post thoracentesis that shows     right-sided chest tube has been removed without evidence of right-     sided pneumothorax as discussed above. 8. Two-view chest x-ray, August 20, 2010 that shows moderate right     pleural effusion with basilar dependent air space consolidation in     the right hemithorax as well as mild left lower lobe air space     disease. 9. Two-view chest x-ray, August 21, 2010 that shows no change in right     lower lobe air space disease and moderate right pleural effusion. 10.CT angio of the chest, on August 21, 2010 which shows no pulmonary     embolus, small-to-moderate loculated right pleural effusion with     locules of presumably postoperative pleural air.  There has also     been found right middle and right lower lobe collapse/consolidation     which has the appearance of pneumonia.  Question mild parenchymal     necrosis in the right lower lobe.  Mediastinal right hilar lymph     nodes may be reactive. 11.Two-view chest x-ray on August 23, 2010 shows no significant change     since prior exam, moderate right-sided pleural effusion with mild     loculation.  Adjacent airspace disease.  Likely infection. 12.Two-view chest x-ray, August 24, 2010 that showed unchanged moderate     pleural effusion with right lung base atelectasis or consolidation.     Increased left basilar  atelectasis. 13.Two-view chest x-ray, August 26, 2010 that shows moderate right     pleural effusion with associated lower lung atelectasis, unchanged.  LABORATORY DATA:  The pleural fluid from August 15, 2010, total protein was 4.8, LDH was 632 appearance on the cell count was turbid with WBCs 13,904 with segmented neutrophils 80, lymphocytes 14, a few mesothelial cells were present.  Sputum culture from August 15, 2010 was negative. MRSA, PCR screening was negative.  Serum LDH August 16, 2010 was 151, hemoglobin A1c August 16, 2010 was 6.6.  ABG August 17, 2010 postop with a temperature of 101.6, pH of 7.34, pCO2 of 50, pO2 of 62, bicarbonate 27, pCO2 of 28, acid base excess 1.0, O2  saturations 87%, supplemental oxygen not documented.  Respiratory culture from August 15, 2010 shows normal oropharynx pleura.  Pleural fluid culture from August 15, 2010 shows no growth.  Pleural fluid culture from August 16, 2010 shows no growth.  Blood cultures from August 15, 2010, first x2 showed no growth. Most recent electrolyte panel from August 23, 2010 sodium 136, potassium 3.7, chloride 101, CO2 of 27, glucose 101, BUN 8, creatinine 1.10.  Most recent CBC is from August 26, 2010 which is date of discharge, white count 10,100, hemoglobin 12.1, hematocrit 36.1, platelets 404,000.  HOSPITAL COURSE: 1. Acute hypoxemic respiratory failure secondary to community-acquired     pneumonia with associated empyema.  The patient was admitted to the     step-down unit where he was monitored closely given supplemental     oxygen and started empirically on Zosyn and Zithromax.  Unfortunately     after 2 days of therapy, he did not improve and continued to have     significant pleuritic chest pain, fevers and hypoxemia.  Therefore,     an ultrasound-guided diagnostic and therapeutic right thoracentesis     was performed by Interventional Radiology yielding 530 mL of amber     blood-tinged fluid.  Cardiothoracic surgery was also  consulted and     plans were to proceed with a VATS procedure for drainage of the     empyema.  The patient underwent this procedure on August 16, 2010.     In addition due to significant hypoxemic respiratory failure,     pulmonary Critical Care Medicine evaluate the patient     preoperatively and follow the patient in the postoperative period.     The patient did well post thoracentesis.  He did not require     postoperative ventilatory support and by postop day #1 was able to     be moved out of the ICU setting.  TCTS removed the chest tube on     postop day #3.  Unfortunately, the patient persisted with     significant issues related to fevers, hypoxemia and abnormal chest     x-ray despite having negative cultures and again being post VATS     procedure with drainage of empyema.  There was concern that he may     have a pulmonary embolus, so a CT angio of the chest was ordered by     CT surgery that shows no evidence of pulmonary embolus, but     questioned worsen pneumonia versus parenchymal necrosis.  Again     given the fact that the patient had no leukocytosis, no fever or     negative intraoperative pleural cultures, it was suspected that the     primary etiology for the changes in the right lung were related to     parenchymal necrosis.  The patient was eventually transitioned off     Zosyn to Augmentin 24 hours prior to discharge.  Unfortunately, he     has continued with hypoxemic respiratory failure due to low lung     volumes on the right despite adequate treatment with pain     medications.  He is performing incentive spirometer up to 1250.  He     was ambulated prior to discharge on room air and desaturated down     to 82%, and it was felt from an Internal Medicine as well as     Thoracic Surgery standpoint that the patient would benefit from     continuing  oxygen at home.  He will follow up with Thoracic Surgery     after discharge.  Home health has been asked to assist  with     management of the patient's oxygen at home.  The patient will     continue Augmentin p.o. for 7 more days after discharge. 2. Acute kidney injury.  The patient presented with acute respiratory     infection and reported anorexia.  He was also on a 3 drug regimen     combo pill for his hypertension that included a diuretic and an     angiotensin receptor blocking agent.  He was also on metformin     prior to admission.  In the immediate postop period, the patient     had a mild acute kidney injury with creatinine increasing to 1.9.     This was effectively treated with hydration and as of today, the     patient's creatinine is 1.1.  Because of acute kidney injury, NSAID     type medications were not utilized to assist with pain management     in this admission. 3. Hypertension.  The patient's blood pressure has been relatively     well controlled in this admission.  During the early portion of the     hospitalization, he actually had a relative hypotension which     resulted in placing his antihypertensive medications on hold. Over     the past week his blood pressure has began to rebound     after hydration. Because of the acute kidney injury only the     Norvasc was restarted.  Several days prior to discharge, the ARB     was restarted.  The diuretics have remained on hold, but we will     restart  after the patient is discharged home.  On the date of     discharge, the patient's vital signs are stable with a blood pressure of 133/70. 4. Diabetes 2, the patient's glucoses have remained well controlled      this hospitalization with the utilization of sliding scale insulin.     We will resume his Glumetza after discharge.  FINAL DISCHARGE DIAGNOSES: 1. Acute hypoxemic respiratory failure secondary to community-acquired     pneumonia with right empyema. 2. Status post VATS procedure to drain empyema, August 17, 2010 by Dr.     Cornelius Moras. 3. Acute kidney injury, postoperatively due to  volume depletion, now     resolved. 4. Hypertension, controlled. 5. Diabetes 2, controlled.  DISCHARGE MEDICATIONS: 1. Augmentin 875 mg b.i.d. for the next 7 days. 2. Guaifenesin 600 mg p.o. b.i.d. 3. Percocet 5/325 two tablets every 4 hours as needed for pain. 4. Exforge HCT 1 tablet by mouth daily, this is the 10/320/25 mg     formulation. 5. Glumetza 500 mg 1 tablet every evening. 6. Ventolin albuterol inhaler 2 puffs every 4 hours as needed for     wheezing or shortness of breath.  Stop the following medication, amoxicillin 500 mg t.i.d.  DISCHARGE INSTRUCTIONS:  Return to work.  Please see work note provided. The patient needs to be at work a minimum of 2 weeks and is to be reevaluated after discharge by attending physician either Dr. Renaye Rakers or Dr. Tressie Stalker with Thoracic Surgery.  ACTIVITY:  Increase activity slowly, may walk up steps, may shower or bathe with oxygen in place.  DIET:  Diabetic carb modified.  TREATMENTS: 1. Continue oxygen 2 L per minute continuous.  Follow up appointment     to have an appointment with Dr. Orvan July physician assistant,     telephone number [phone] on September 16, 2010 at 1:30.  You have a     chest x-ray to be done prior to this visit at 1 p.m. 2. You need to call Dr. Renaye Rakers, telephone number [phone] to be     seen in 1 week.  HOME HEALTH:  [facility].     Allison L. Rennis Harding, N.P.   ______________________________ Lonia Blood, M.D.    ALE/MEDQ  D:  08/26/2010  T:  08/26/2010  Job:  [id number]  cc:   Renaye Rakers, M.D. Salvatore Decent. Cornelius Moras, M.D.  Electronically Signed by Junious Silk N.P. on 09/06/2010 01:02:19 PM Electronically Signed by Jetty Duhamel M.D. on 09/10/2010 06:21:38 PM

## 2010-09-13 ENCOUNTER — Other Ambulatory Visit: Payer: Self-pay | Admitting: Thoracic Surgery (Cardiothoracic Vascular Surgery)

## 2010-09-13 DIAGNOSIS — J869 Pyothorax without fistula: Secondary | ICD-10-CM

## 2010-09-16 ENCOUNTER — Ambulatory Visit
Admission: RE | Admit: 2010-09-16 | Discharge: 2010-09-16 | Disposition: A | Discharge status: Home / Self Care | Payer: PRIVATE HEALTH INSURANCE | Source: Ambulatory Visit | Attending: Thoracic Surgery (Cardiothoracic Vascular Surgery) | Admitting: Thoracic Surgery (Cardiothoracic Vascular Surgery)

## 2010-09-16 ENCOUNTER — Encounter (INDEPENDENT_AMBULATORY_CARE_PROVIDER_SITE_OTHER): Payer: Self-pay

## 2010-09-16 DIAGNOSIS — J869 Pyothorax without fistula: Secondary | ICD-10-CM

## 2010-09-17 NOTE — Assessment & Plan Note (Unsigned)
OFFICE VISIT  Dauzat, Dariel A DOB:  [birth year]                                        September 16, 2010 CHART #:  [id number]  REASON FOR OFFICE VISIT:  Routine followup status post right VATS.  HISTORY OF PRESENT ILLNESS:  This is a 40 year old African American male with a history of hypertension and type 2 diet who was admitted to [facility] with pneumonia.  He was found to have a right empyema and underwent a right VATS for drainage of empyema by Dr. Cornelius Moras on August 16, 2010.  The patient was discharged from the hospital in stable condition on August 26, 2010.  The patient currently denies any shortness of breath, chest pain, fever, or chills.  His complaints include insomnia, occasional incisional pain, as well as some pain on the right side anteriorly of his ribs.  PHYSICAL EXAMINATION:  GENERAL:  This is a pleasant 40 year old [place] American male who is in no acute distress who is alert, oriented, and cooperative. VITAL SIGNS:  As follows, BP 129/94, heart rate 86, respirations 20, O2 sat 95% on room air.  CARDIOVASCULAR:  Regular rate and rhythm. PULMONARY:  Slightly decreased at the right base left lung clear. ABDOMEN:  Soft, nontender.  Bowel sounds present.  EXTREMITIES:  No cyanosis, clubbing, or edema.  WOUNDS:  Clean, dry, no signs of infection.  Chest x-ray done today shows atelectasis or scarring in the right middle lobe, left lung clear, probable trace right pleural effusion.  IMPRESSION AND PLAN:  Overall, the patient is surgically stable status post right VATS for drainage of an empyema.  The patient is requesting a prescription for narcotics.  He was instructed that hopefully in time this pain will continue to decrease.  He has not been using oxygen at home over the last several days.  He has 95% on room air at this office visit, and the O2 may be discontinued regarding the patient's history of diabetes and hypertension.  He has already seen  a medical doctor and will continue to be followed closely.  Regarding the patient's insomnia, he was instructed he may either try something over the counter or request a prescription for from his medical care doctor.  Finally regarding the patient's return to work, he states that he will most like return to work in 1-2 weeks.  He feels he needs a little bit more time as he is still fairly deconditioned.  He was instructed not to lift anything more than 10 pounds for at least 1-2 more weeks.  He will be seen by our office on a p.r.n. basis.  Doree Fudge, PA  DZ/MEDQ  D:  09/16/2010  T:  09/17/2010  Job:  [id number]

## 2012-01-21 IMAGING — CT CT ANGIO CHEST
2 of 6 series · 18 of 36 positions shown · IV contrast (APPLIED)
Comparison: Chest radiograph 08/21/2010 and CT chest 08/15/2010

CLINICAL DATA: Chest congestion and shortness of breath with right
lung surgery earlier this week.

CT ANGIOGRAPHY CHEST WITH CONTRAST
TECHNIQUE: Multidetector CT imaging of the chest was performed
using the standard protocol during bolus administration of
intravenous contrast.  Multiplanar CT image reconstructions
including MIPs were obtained to evaluate the vascular anatomy.
Contrast:  100 ml Omnipaque-LYY.

[Series 8: pulm embolism 1.0 b25f thins · axial · 0.78mm/px · z∈[-272,-20]mm · 17 of 283 slices shown]
[im 15/283  lung]
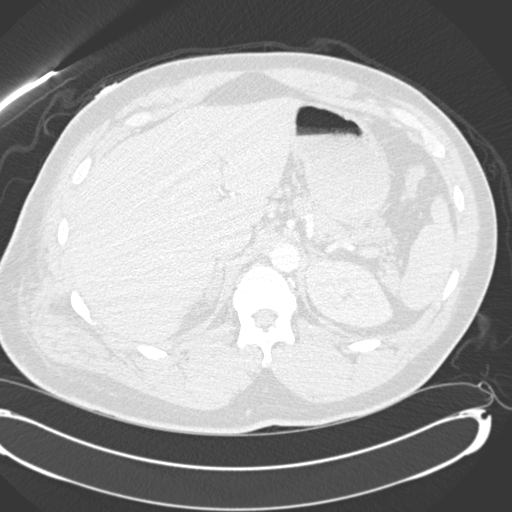
[im 29/283  mediastinal]
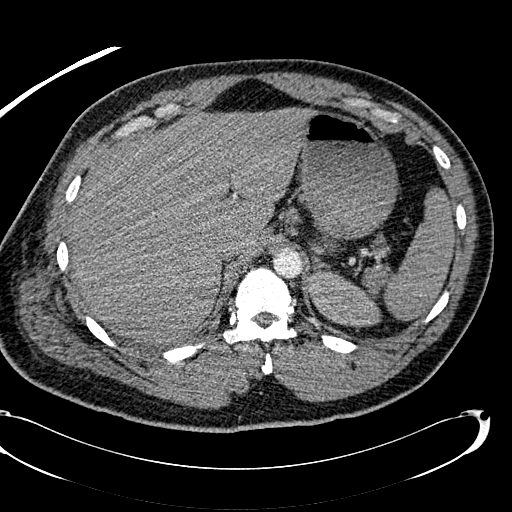
[im 43/283  lung]
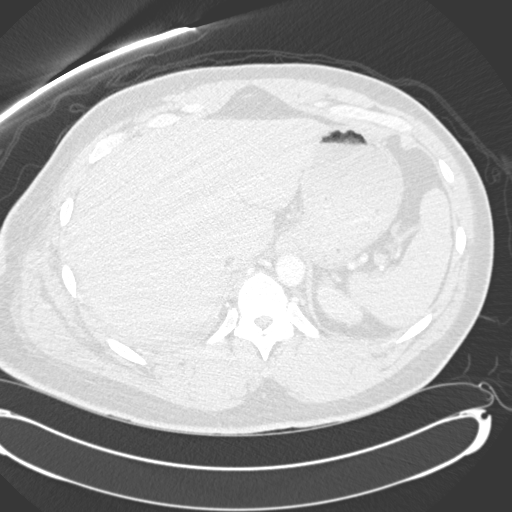
[im 57/283  mediastinal]
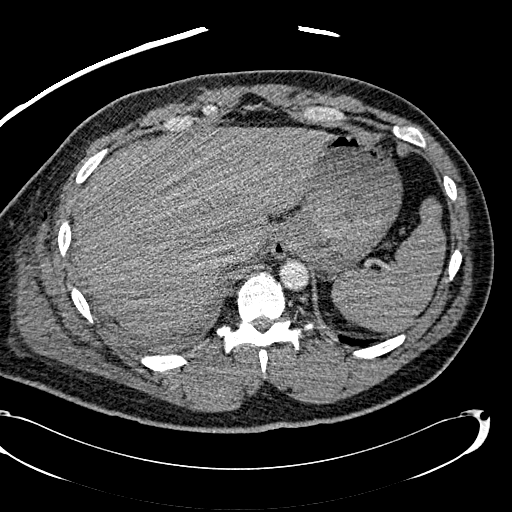
[im 85/283  lung]
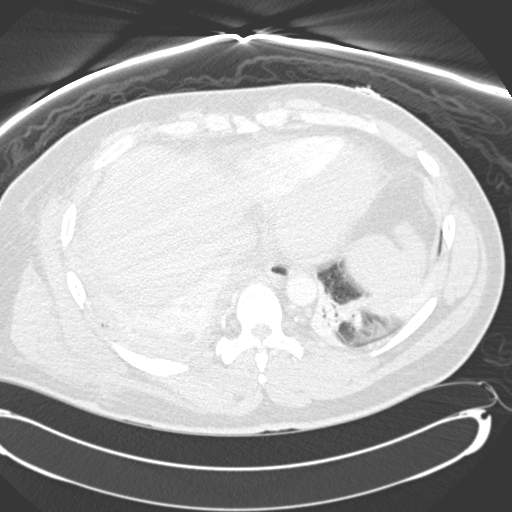
[im 99/283  mediastinal]
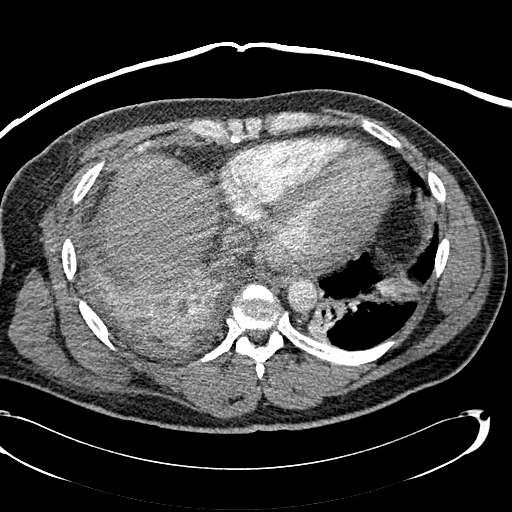
[im 113/283  lung]
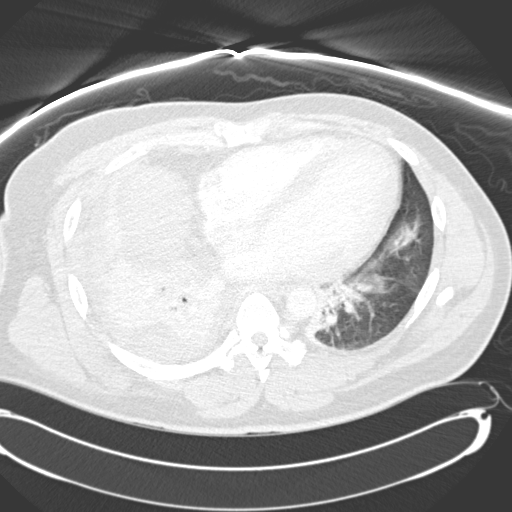
[im 127/283  mediastinal]
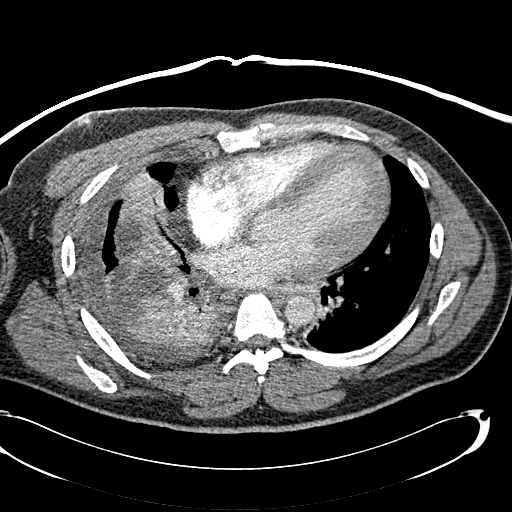
[im 142/283  lung]
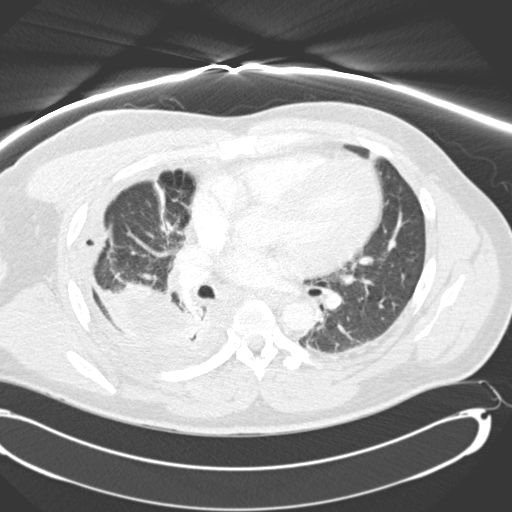
[im 156/283  mediastinal]
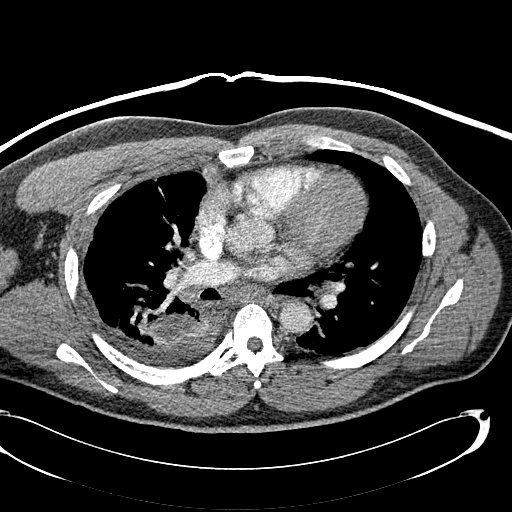
[im 170/283  lung]
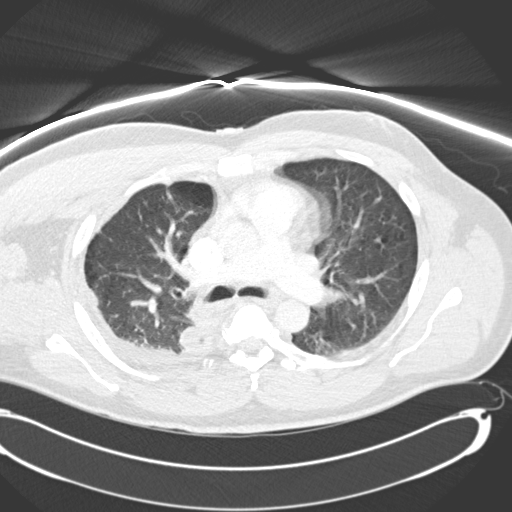
[im 184/283  mediastinal]
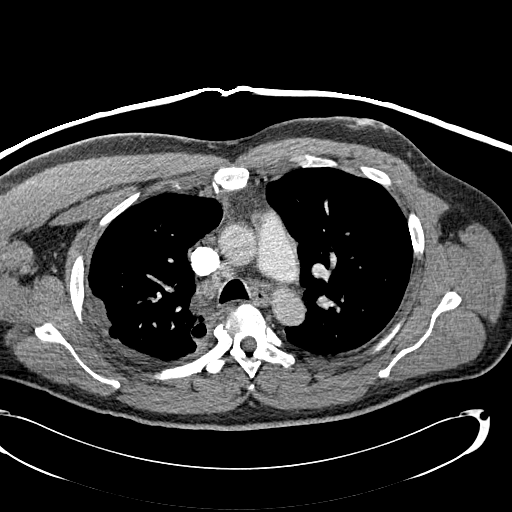
[im 198/283  lung]
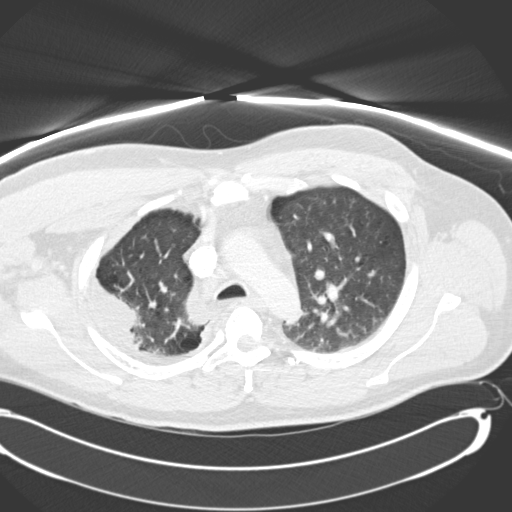
[im 226/283  mediastinal]
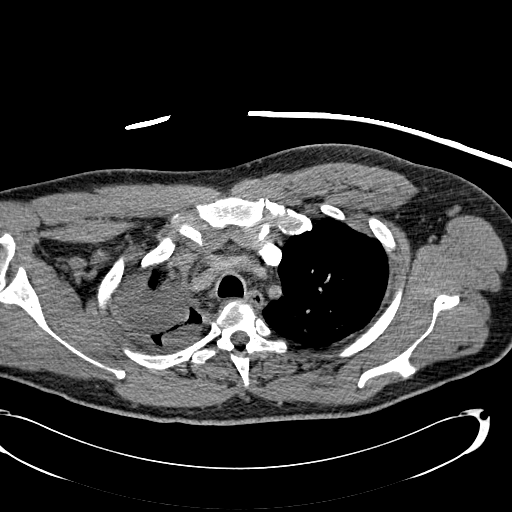
[im 240/283  lung]
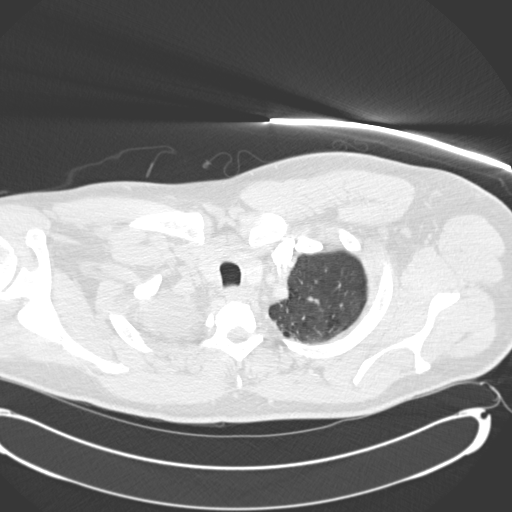
[im 254/283  mediastinal]
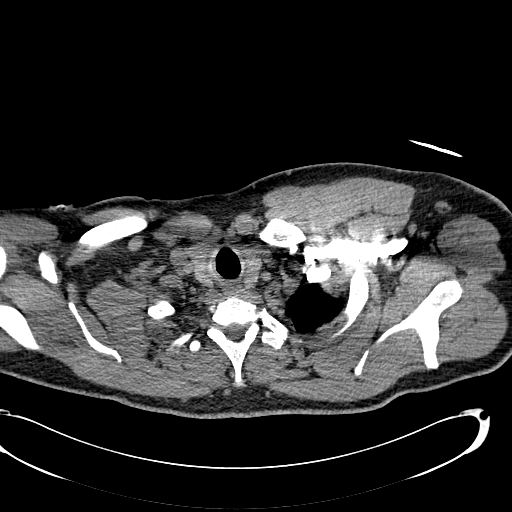
[im 268/283  lung]
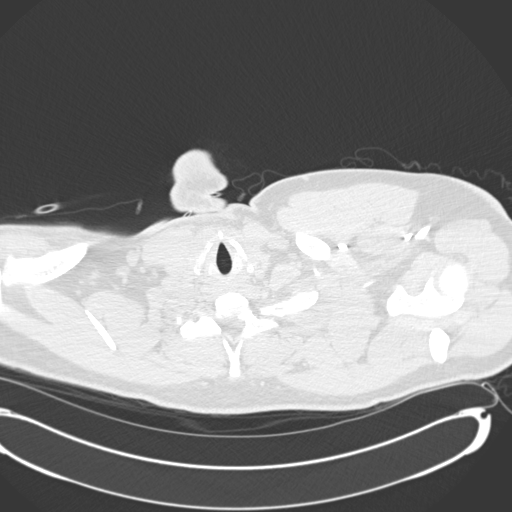

[Series 602: coronal mpr · coronal · 0.78mm/px · 1 of 74 slices shown]
[im 37/74  mediastinal]
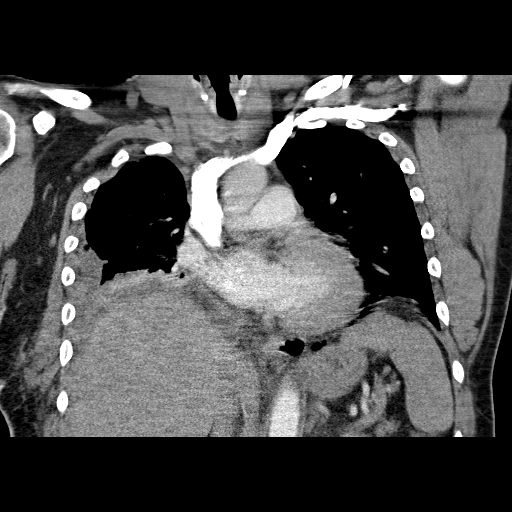

[18 of 36 positions shown; findings below may reference images not displayed]

FINDINGS: No pulmonary embolus.  Mediastinal lymph nodes measure
up to 1.2 cm in short axis in the low right paratracheal station.
Right hilar lymph nodes measure 1.2 cm.  Low internal jugular lymph
nodes are not enlarged by CT size criteria.  No axillary
adenopathy.  Heart is mildly enlarged.  No pericardial effusion.

Small right pleural effusion has areas of loculation apically, as
well as along the right major fissure.  There are a few locules of
right pleural air, which are presumably postoperative in etiology.
Collapse/consolidation is seen predominantly in the right lower
lobe, with some involvement of the right middle lobe.  There are
areas of low attenuation within the dependent portion of the
consolidated right lower lobe.

Minimal airspace disease in the left lower lobe. Scattered
paraseptal emphysema in the left hemithorax, some of which
simulates pleural air.  No left pleural fluid.  Airway is
unremarkable.

Incidental imaging of the upper abdomen shows small juxta
diaphragmatic lymph nodes.  No worrisome lytic or sclerotic
lesions.

Review of the MIP images confirms the above findings.
IMPRESSION: 1.  No pulmonary embolus.
2.  Small to moderate loculated right pleural effusion with locules
of presumably postoperative pleural air.
2.  Right middle and right lower lobe collapse/consolidation has
the appearance of pneumonia.  Question mild parenchymal necrosis in
the right lower lobe.
2.  Mediastinal and right hilar lymph nodes may be reactive.

## 2012-01-26 IMAGING — CR DG CHEST 2V
2 series · 2 of 2 positions shown · non-contrast
Comparison: 08/24/2010

CLINICAL DATA: Postop right lung surgery

CHEST - 2 VIEW

[w chest pa]
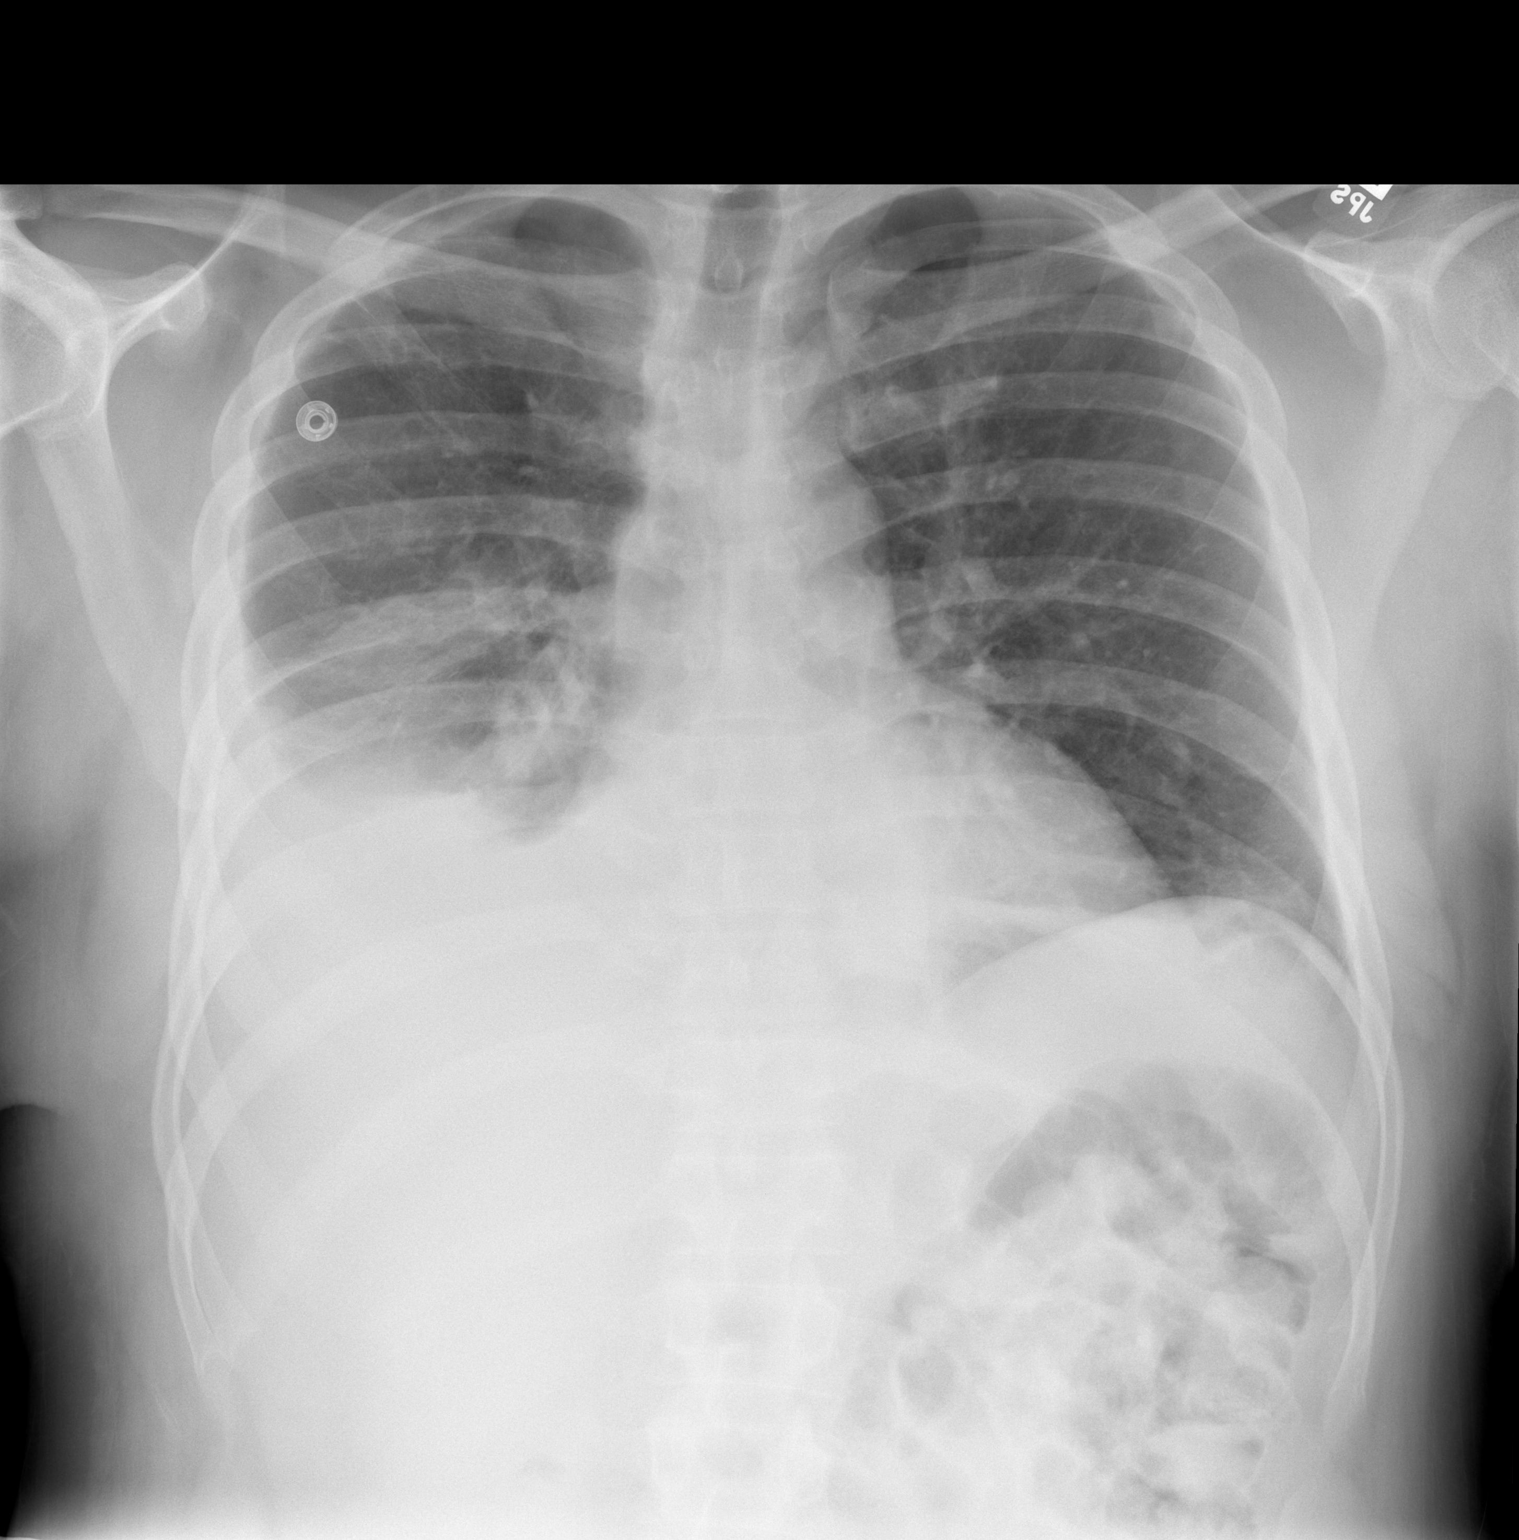

[w chest lat]
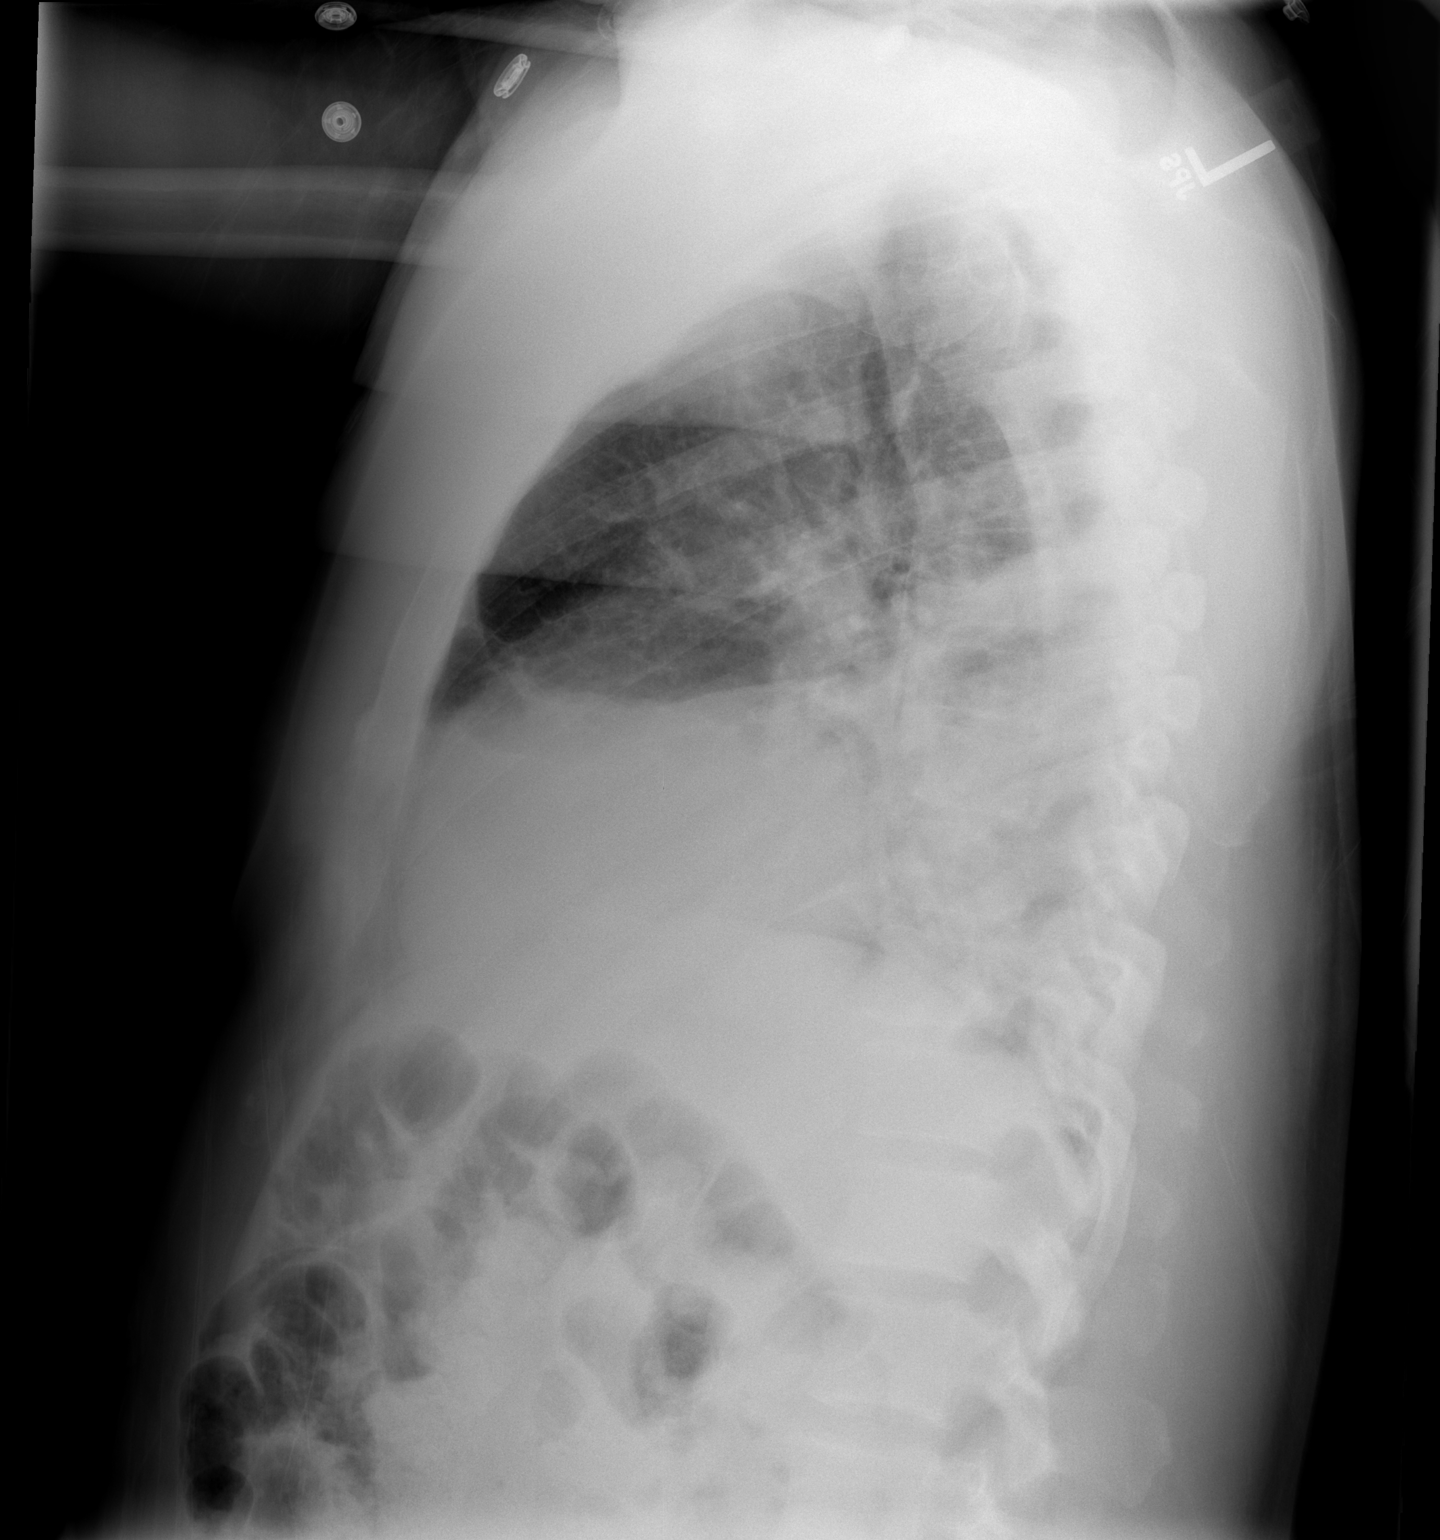

[2 of 2 positions shown; findings below may reference images not displayed]

FINDINGS: Moderate right pleural effusion with associated lower
lung atelectasis.  Left lung is clear.  No pneumothorax.

Cardiomediastinal silhouette is unchanged.

Visualized osseous structures are within normal limits.
IMPRESSION: Moderate right pleural effusion with associated lower lung
atelectasis, unchanged.

## 2016-07-25 ENCOUNTER — Emergency Department (HOSPITAL_COMMUNITY)
Admission: EM | Admit: 2016-07-25 | Discharge: 2016-07-25 | Disposition: A | Discharge status: Home / Self Care | Payer: No Typology Code available for payment source | Attending: Emergency Medicine | Admitting: Emergency Medicine

## 2016-07-25 ENCOUNTER — Emergency Department (HOSPITAL_COMMUNITY): Payer: No Typology Code available for payment source

## 2016-07-25 ENCOUNTER — Encounter (HOSPITAL_COMMUNITY): Payer: Self-pay | Admitting: *Deleted

## 2016-07-25 DIAGNOSIS — I110 Hypertensive heart disease with heart failure: Secondary | ICD-10-CM | POA: Insufficient documentation

## 2016-07-25 DIAGNOSIS — F17200 Nicotine dependence, unspecified, uncomplicated: Secondary | ICD-10-CM | POA: Diagnosis not present

## 2016-07-25 DIAGNOSIS — R202 Paresthesia of skin: Secondary | ICD-10-CM | POA: Diagnosis not present

## 2016-07-25 DIAGNOSIS — J449 Chronic obstructive pulmonary disease, unspecified: Secondary | ICD-10-CM | POA: Insufficient documentation

## 2016-07-25 DIAGNOSIS — R079 Chest pain, unspecified: Secondary | ICD-10-CM | POA: Diagnosis not present

## 2016-07-25 DIAGNOSIS — I509 Heart failure, unspecified: Secondary | ICD-10-CM | POA: Diagnosis not present

## 2016-07-25 DIAGNOSIS — Z79899 Other long term (current) drug therapy: Secondary | ICD-10-CM | POA: Insufficient documentation

## 2016-07-25 HISTORY — DX: Essential (primary) hypertension: I10

## 2016-07-25 HISTORY — DX: Heart failure, unspecified: I50.9

## 2016-07-25 LAB — BASIC METABOLIC PANEL
ANION GAP: 11 (ref 5–15)
BUN: 17 mg/dL (ref 6–20)
CHLORIDE: 104 mmol/L (ref 101–111)
CO2: 26 mmol/L (ref 22–32)
CREATININE: 1.22 mg/dL (ref 0.61–1.24)
Calcium: 9.9 mg/dL (ref 8.9–10.3)
GFR calc non Af Amer: >60 mL/min (ref >60)
Glucose, Bld: 107 mg/dL — ABNORMAL HIGH (ref 65–99)
POTASSIUM: 4.1 mmol/L (ref 3.5–5.1)
SODIUM: 141 mmol/L (ref 135–145)

## 2016-07-25 LAB — CBC
HEMATOCRIT: 43.8 % (ref 39.0–52.0)
HEMOGLOBIN: 14.5 g/dL (ref 13.0–17.0)
MCH: 27.6 pg (ref 26.0–34.0)
MCHC: 33.1 g/dL (ref 30.0–36.0)
MCV: 83.3 fL (ref 78.0–100.0)
PLATELETS: 240 10*3/uL (ref 150–400)
RBC: 5.26 MIL/uL (ref 4.22–5.81)
RDW: 13.8 % (ref 11.5–15.5)
WBC: 9.8 10*3/uL (ref 4.0–10.5)

## 2016-07-25 LAB — I-STAT TROPONIN, ED
Troponin i, poc: 0.01 ng/mL (ref 0.00–0.08)
Troponin i, poc: 0 ng/mL (ref 0.00–0.08)

## 2016-07-25 MED ORDER — BENZONATATE 100 MG PO CAPS: 100.0000 mg | ORAL_CAPSULE | Freq: Three times a day (TID) | ORAL | 0 refills | Status: AC

## 2016-07-25 NOTE — ED Triage Notes (Signed)
Pt states constant numbness and tingling to 4th and 5th digits x 1 week and chest pain (initially stated under L arm pit, but now states under L breast) since he was sitting in waiting room.  Pt also keeps mentioning that when he coughs, his L knee hurts.

## 2016-07-25 NOTE — Discharge Instructions (Signed)

## 2016-07-25 NOTE — ED Notes (Signed)
Radiology recommends CT d/t new finding on xray, results in EPIC

## 2016-07-25 NOTE — ED Notes (Signed)
Pt BP elevated patient states he has high BP normally, ambulatory and stable at discharge

## 2016-07-25 NOTE — ED Notes (Signed)
Patient now c/o left sided chest pain increasing.  This RN reviewed chart and spoke to Dr. Adela LankFloyd.  Patient reassured, will room with next available room.

## 2016-07-25 NOTE — ED Provider Notes (Signed)
Emergency Department Provider Note   I have reviewed the triage vital signs and the nursing notes.   HISTORY  Chief Complaint Tingling and Chest Pain (below L armpit )   HPI Zachary Davidson is a 46 y.o. male with PMH of CHF and HTN presents to the emergency department for evaluation of multiple medical complaints including left-sided chest pain which began 2 hours ago, left ring and little finger numbness for the past 2 days, and left knee pain with coughing. Patient states he initially presented to the emergency department because of finger numbness. Denies any injury to the arm. No neck pain. No weakness in the hand. He has a remote history of rotator cuff surgery on that shoulder. Symptoms are not gone away and so became concerned and came in. Is also expressing some sharp left-sided, nonradiating pain in his knee. He describes as primarily lateral. He has not tried any over-the-counter medications for this pain. While waiting in the emergency department waiting room he developed some left sided "soreness" and "cramping pain" that is non-radiating. No fever, chills, or SOB. He has a remote history of PNA that required chest tube placement in 2012 but nothing of significance since.   Past Medical History:  Diagnosis Date  . CHF (congestive heart failure) (HCC)   . Hypertension     Patient Active Problem List   Diagnosis Date Noted  . HYPERTENSION 10/03/2009  . COPD UNSPECIFIED 10/03/2009  . DYSPNEA 10/03/2009    Past Surgical History:  Procedure Laterality Date  . CHEST TUBE INSERTION    . ROTATOR CUFF REPAIR        Allergies Penicillins and Shellfish allergy  No family history on file.  Social History Social History  Substance Use Topics  . Smoking status: Current Some Day Smoker    Packs/day: 0.25  . Smokeless tobacco: Never Used  . Alcohol use Yes     Comment: occ    Review of Systems  Constitutional: No fever/chills Eyes: No visual changes. ENT: No sore  throat. Cardiovascular: Positive chest pain. Respiratory: Denies shortness of breath. Gastrointestinal: No abdominal pain.  No nausea, no vomiting.  No diarrhea.  No constipation. Genitourinary: Negative for dysuria. Musculoskeletal: Negative for back pain. Positive left hand tingling and left knee pain when coughing.  Skin: Negative for rash. Neurological: Negative for headaches, focal weakness or numbness.  10-point ROS otherwise negative.  ____________________________________________   PHYSICAL EXAM:  VITAL SIGNS: ED Triage Vitals  Enc Vitals Group     BP 07/25/16 1412 134/93     Pulse Rate 07/25/16 1412 81     Resp 07/25/16 1412 16     Temp 07/25/16 1412 98.1 F (36.7 C)     Temp Source 07/25/16 1412 Oral     SpO2 07/25/16 1412 96 %     Weight 07/25/16 1413 251 lb (113.9 kg)     Height 07/25/16 1413 6\' 1"  (1.854 m)     Pain Score 07/25/16 1414 5   Constitutional: Alert and oriented. Well appearing and in no acute distress. Eyes: Conjunctivae are normal.  Head: Atraumatic. Nose: No congestion/rhinnorhea. Mouth/Throat: Mucous membranes are moist.  Neck: No stridor.   Cardiovascular: Normal rate, regular rhythm. Good peripheral circulation. Grossly normal heart sounds.   Respiratory: Normal respiratory effort.  No retractions. Lungs CTAB. Gastrointestinal: Soft and nontender. No distention.  Musculoskeletal: No lower extremity tenderness nor edema. No gross deformities of extremities. Mild tenderness to palpation of the left knee. No edema, erythema, or  joint warmth.  Neurologic:  Normal speech and language. No weakness. Subjective tingling in the left 4th and 5th digits.  Skin:  Skin is warm, dry and intact. No rash noted. Psychiatric: Mood and affect are normal. Speech and behavior are normal.  ____________________________________________   LABS (all labs ordered are listed, but only abnormal results are displayed)  Labs Reviewed  BASIC METABOLIC PANEL - Abnormal;  Notable for the following:       Result Value   Glucose, Bld 107 (*)    All other components within normal limits  CBC  I-STAT TROPOININ, ED  I-STAT TROPOININ, ED   ____________________________________________  EKG   EKG Interpretation  Date/Time:  Friday July 25 2016 14:16:51 EST Ventricular Rate:  81 PR Interval:  158 QRS Duration: 98 QT Interval:  404 QTC Calculation: 469 R Axis:   37 Text Interpretation:  Normal sinus rhythm Cannot rule out Inferior infarct , age undetermined Abnormal ECG None Confirmed by LONG MD, JOSHUA [phone]) on 07/25/2016 8:00:17 PM       ____________________________________________  RADIOLOGY  Dg Chest 2 View  Result Date: 07/25/2016 CLINICAL DATA:  Chest pain for 1 month EXAM: CHEST  2 VIEW COMPARISON:  September 16, 2010 FINDINGS: There is a somewhat ill-defined opacity in the right upper lobe measuring 1.3 x 1.2 cm. There is a small right pleural effusion. Lungs elsewhere clear. Heart size and pulmonary vascularity are normal. No adenopathy. No bone lesions. IMPRESSION: Ill-defined nodular appearing opacity right upper lobe seen only on the frontal view. Given an apparent change from prior study, noncontrast enhanced chest CT to further evaluate this opacity is warranted. Small right pleural effusion. Lungs elsewhere clear. Stable cardiac silhouette. These results will be called to the ordering clinician or representative by the Radiologist Assistant, and communication documented in the PACS or zVision Dashboard. Electronically Signed   By: Bretta Bang III M.D.   On: 07/25/2016 14:57   Ct Chest Wo Contrast  Result Date: 07/25/2016 CLINICAL DATA:  Right upper lobe abnormality seen on chest radiograph EXAM: CT CHEST WITHOUT CONTRAST TECHNIQUE: Multidetector CT imaging of the chest was performed following the standard protocol without IV contrast. COMPARISON:  Chest radiograph 07/25/2016 FINDINGS: Cardiovascular: Heart size is normal. No pericardial  effusion. The course and caliber of the thoracic aorta are normal. There is a normal variant aortic arch branching pattern with the brachiocephalic and left common carotid arteries sharing a common origin. Mediastinum/Nodes: Mediastinal lymph nodes measure up to 1 cm (level 4). No hilar or axillary lymphadenopathy. The visualized thyroid and thoracic course of the esophagus are unremarkable. Lungs/Pleura: There is bilateral apical predominant emphysema with multiple areas of bulla formation. There is pleuroparenchymal scarring at the right lung apex that likely accounts for the abnormality seen on chest radiograph. There are no pulmonary nodules or masses. No pleural effusion or focal consolidation. Upper Abdomen: The noncontrast appearance of the upper abdominal organs is normal. Musculoskeletal: There is no focal osseous abnormality. IMPRESSION: 1. Emphysema with apical pleural-parenchymal scarring that likely accounts for the opacity seen on the concomitant chest radiograph. No lung mass. 2. No acute thoracic abnormality. Electronically Signed   By: Deatra Robinson M.D.   On: 07/25/2016 20:15    ____________________________________________   PROCEDURES  Procedure(s) performed:   Procedures  None ____________________________________________   INITIAL IMPRESSION / ASSESSMENT AND PLAN / ED COURSE  Pertinent labs & imaging results that were available during my care of the patient were reviewed by me and considered in my  medical decision making (see chart for details).  Patient presents to the ED with CP and left 4th and 5th digit tingling. CP is atypical. PERC negative and low risk by Wells. HEART score 1. Initial troponin and EKG are normal. Patient's neuro deficit seems most consistent with peripheral never compression at the wrist. No other deficits. No evidence to suggest central cause of numbness. Will obtain CT chest with abnormality seen on CXR.   08:25 PM Patient with scarring on the RUL  on CT. No PNA or mass. Plan for discharge after repeat troponin with PCP follow up and referral to Neurology/Ortho is numbness continues.   At this time, I do not feel there is any life-threatening condition present. I have reviewed and discussed all results (EKG, imaging, lab, urine as appropriate), exam findings with patient. I have reviewed nursing notes and appropriate previous records.  I feel the patient is safe to be discharged home without further emergent workup. Discussed usual and customary return precautions. Patient and family (if present) verbalize understanding and are comfortable with this plan.  Patient will follow-up with their primary care provider. If they do not have a primary care provider, information for follow-up has been provided to them. All questions have been answered.  ____________________________________________  FINAL CLINICAL IMPRESSION(S) / ED DIAGNOSES  Final diagnoses:  Chest pain, unspecified type  Paresthesia     MEDICATIONS GIVEN DURING THIS VISIT:  None  NEW OUTPATIENT MEDICATIONS STARTED DURING THIS VISIT:  None   Note:  This document was prepared using Dragon voice recognition software and may include unintentional dictation errors.  Alona Bene, MD Emergency Medicine   Maia Plan, MD 07/26/16 [phone]

## 2016-10-05 ENCOUNTER — Encounter (HOSPITAL_COMMUNITY): Payer: Self-pay | Admitting: Emergency Medicine

## 2016-10-05 ENCOUNTER — Emergency Department (HOSPITAL_COMMUNITY): Payer: PRIVATE HEALTH INSURANCE

## 2016-10-05 ENCOUNTER — Emergency Department (HOSPITAL_COMMUNITY)
Admission: EM | Admit: 2016-10-05 | Discharge: 2016-10-05 | Disposition: A | Discharge status: Home / Self Care | Payer: PRIVATE HEALTH INSURANCE | Attending: Emergency Medicine | Admitting: Emergency Medicine

## 2016-10-05 DIAGNOSIS — R1011 Right upper quadrant pain: Secondary | ICD-10-CM | POA: Insufficient documentation

## 2016-10-05 DIAGNOSIS — I110 Hypertensive heart disease with heart failure: Secondary | ICD-10-CM | POA: Insufficient documentation

## 2016-10-05 DIAGNOSIS — Z79899 Other long term (current) drug therapy: Secondary | ICD-10-CM | POA: Insufficient documentation

## 2016-10-05 DIAGNOSIS — J449 Chronic obstructive pulmonary disease, unspecified: Secondary | ICD-10-CM | POA: Insufficient documentation

## 2016-10-05 DIAGNOSIS — I509 Heart failure, unspecified: Secondary | ICD-10-CM | POA: Insufficient documentation

## 2016-10-05 DIAGNOSIS — F17200 Nicotine dependence, unspecified, uncomplicated: Secondary | ICD-10-CM | POA: Insufficient documentation

## 2016-10-05 LAB — URINALYSIS, ROUTINE W REFLEX MICROSCOPIC
Bilirubin Urine: NEGATIVE
Glucose, UA: NEGATIVE mg/dL
Hgb urine dipstick: NEGATIVE
Ketones, ur: NEGATIVE mg/dL
Leukocytes, UA: NEGATIVE
Nitrite: NEGATIVE
Protein, ur: NEGATIVE mg/dL
Specific Gravity, Urine: 1.012 (ref 1.005–1.030)
pH: 5 (ref 5.0–8.0)

## 2016-10-05 LAB — TROPONIN I: Troponin I: <0.03 ng/mL (ref <0.03)

## 2016-10-05 LAB — COMPREHENSIVE METABOLIC PANEL
ALBUMIN: 4 g/dL (ref 3.5–5.0)
ALT: 32 U/L (ref 17–63)
ANION GAP: 11 (ref 5–15)
AST: 37 U/L (ref 15–41)
Alkaline Phosphatase: 90 U/L (ref 38–126)
BUN: 16 mg/dL (ref 6–20)
CHLORIDE: 102 mmol/L (ref 101–111)
CO2: 22 mmol/L (ref 22–32)
Calcium: 9.4 mg/dL (ref 8.9–10.3)
Creatinine, Ser: 1.06 mg/dL (ref 0.61–1.24)
GFR calc Af Amer: >60 mL/min (ref >60)
GFR calc non Af Amer: >60 mL/min (ref >60)
GLUCOSE: 121 mg/dL — AB (ref 65–99)
POTASSIUM: 3.4 mmol/L — AB (ref 3.5–5.1)
SODIUM: 135 mmol/L (ref 135–145)
Total Bilirubin: 1.1 mg/dL (ref 0.3–1.2)
Total Protein: 7.1 g/dL (ref 6.5–8.1)

## 2016-10-05 LAB — CBC
HEMATOCRIT: 43.6 % (ref 39.0–52.0)
HEMOGLOBIN: 14.5 g/dL (ref 13.0–17.0)
MCH: 27.2 pg (ref 26.0–34.0)
MCHC: 33.3 g/dL (ref 30.0–36.0)
MCV: 81.8 fL (ref 78.0–100.0)
Platelets: 196 10*3/uL (ref 150–400)
RBC: 5.33 MIL/uL (ref 4.22–5.81)
RDW: 13.2 % (ref 11.5–15.5)
WBC: 7.7 10*3/uL (ref 4.0–10.5)

## 2016-10-05 LAB — BRAIN NATRIURETIC PEPTIDE: B NATRIURETIC PEPTIDE 5: 23.1 pg/mL (ref 0.0–100.0)

## 2016-10-05 LAB — LIPASE, BLOOD: LIPASE: 27 U/L (ref 11–51)

## 2016-10-05 NOTE — ED Provider Notes (Signed)
[facility]-EMERGENCY DEPT Provider Note   CSN: [id number] Arrival date & time: 10/05/16  4098     History   Chief Complaint Chief Complaint  Patient presents with  . Abdominal Pain    HPI Zachary Davidson is a 46 y.o. male.  HPI Patient presents with concern of right upper quadrant abdominal pain left upper chest pain. Patient notes that over the past few days he has had episodic pain in the upper abdomen, right-sided Today, with more severe pain, he presents for evaluation. Pain is sore, nonradiating. He notes that this morning, after he developed worsening pain in the right abdomen, he also had a moment of left-sided chest pain. No new dyspnea, fever, nausea, vomiting, chills. Patient has been taking all medication as directed. He does note that he has had recent mild weight gain.  This is notable as the patient has history of congestive heart failure, monitors his weight closely. Diet changes, activity changes, medication changes.  Past Medical History:  Diagnosis Date  . CHF (congestive heart failure) (HCC)   . Hypertension     Patient Active Problem List   Diagnosis Date Noted  . HYPERTENSION 10/03/2009  . COPD UNSPECIFIED 10/03/2009  . DYSPNEA 10/03/2009    Past Surgical History:  Procedure Laterality Date  . CHEST TUBE INSERTION    . ROTATOR CUFF REPAIR         Home Medications    Prior to Admission medications   Medication Sig Start Date End Date Taking? Authorizing Provider  albuterol (PROVENTIL HFA;VENTOLIN HFA) 108 (90 Base) MCG/ACT inhaler Inhale 1 puff into the lungs every 6 (six) hours as needed for wheezing or shortness of breath.    Historical Provider, MD  amLODipine (NORVASC) 10 MG tablet Take 10 mg by mouth daily.    Historical Provider, MD  benzonatate (TESSALON) 100 MG capsule Take 1 capsule (100 mg total) by mouth every 8 (eight) hours. 07/25/16   Maia Plan, MD  furosemide (LASIX) 40 MG tablet Take 40 mg by mouth daily.    Historical Provider,  MD  mometasone-formoterol (DULERA) 100-5 MCG/ACT AERO Inhale 2 puffs into the lungs 2 (two) times daily.    Historical Provider, MD  spironolactone (ALDACTONE) 25 MG tablet Take 25 mg by mouth daily.    Historical Provider, MD    Family History No family history on file.  Social History Social History  Substance Use Topics  . Smoking status: Current Some Day Smoker    Packs/day: 0.25  . Smokeless tobacco: Never Used  . Alcohol use Yes     Comment: occ     Allergies   Penicillins and Shellfish allergy   Review of Systems Review of Systems  Constitutional:       Per HPI, otherwise negative  HENT:       Per HPI, otherwise negative  Respiratory:       Per HPI, otherwise negative  Cardiovascular:       Per HPI, otherwise negative  Gastrointestinal: Negative for vomiting.  Endocrine:       Negative aside from HPI  Genitourinary:       Neg aside from HPI   Musculoskeletal:       Per HPI, otherwise negative  Skin: Negative.   Neurological: Negative for syncope.     Physical Exam Updated Vital Signs BP (!) 128/92 (BP Location: Right Arm)   Pulse 60   Temp 98.7 F (37.1 C) (Oral)   Resp 16   Ht  (1.854  m)   Wt 245 lb (111.1 kg)   SpO2 96%   BMI 32.32 kg/m   Physical Exam  Constitutional: He is oriented to person, place, and time. He appears well-developed. No distress.  HENT:  Head: Normocephalic and atraumatic.  Eyes: Conjunctivae and EOM are normal.  Cardiovascular: Normal rate and regular rhythm.   Pulmonary/Chest: Effort normal. No stridor. No respiratory distress.  Abdominal: He exhibits no distension.  Minimal abdominal discomfort with palpation about the upper and right upper abdomen.  Negative Murphy sign  Musculoskeletal: He exhibits no edema.  Neurological: He is alert and oriented to person, place, and time.  Skin: Skin is warm and dry.  Psychiatric: He has a normal mood and affect.  Nursing note and vitals reviewed.    ED Treatments /  Results  Labs (all labs ordered are listed, but only abnormal results are displayed) Labs Reviewed  COMPREHENSIVE METABOLIC PANEL - Abnormal; Notable for the following:       Result Value   Potassium 3.4 (*)    Glucose, Bld 121 (*)    All other components within normal limits  LIPASE, BLOOD  CBC  URINALYSIS, ROUTINE W REFLEX MICROSCOPIC  TROPONIN I  BRAIN NATRIURETIC PEPTIDE     EKG  EKG Interpretation  Date/Time:  Sunday October 05 2016 10:00:47 EDT Ventricular Rate:  61 PR Interval:    QRS Duration: 112 QT Interval:  450 QTC Calculation: 454 R Axis:   36 Text Interpretation:  Sinus rhythm Borderline intraventricular conduction delay Abnormal inferior Q waves Borderline T abnormalities, lateral leads No significant change since last tracing Abnormal ekg Confirmed by Gerhard Munch  MD [phone]) on 10/05/2016 10:16:03 AM       Radiology Dg Chest 2 View  Result Date: 10/05/2016 CLINICAL DATA:  Onset of bilateral chest pain today. EXAM: CHEST  2 VIEW COMPARISON:  CT chest and PA and lateral chest 07/25/2016. FINDINGS: The lungs are emphysematous. Right basilar scar is unchanged. No consolidative process or pneumothorax. No effusion. IMPRESSION: Emphysema without acute disease. Electronically Signed   By: Drusilla Kanner M.D.   On: 10/05/2016 11:06    Procedures Procedures (including critical care time)  Medications Ordered in ED Medications - No data to display  Chart review notable for emergency department evaluation February, 2018, similar presentation.  Initial Impression / Assessment and Plan / ED Course  I have reviewed the triage vital signs and the nursing notes.  Pertinent labs & imaging results that were available during my care of the patient were reviewed by me and considered in my medical decision making (see chart for details).  1:01 PM On repeat exam the patient is awake and alert, in no distress. We discussed all findings at length, and I did a repeat  evaluation of his stomach. With the patient's description of a prior diagnosis of abdominal hernia, I palpated his full abdomen. No appreciable entrapped hernia anywhere, no evidence for peritonitis either. With stable labs, vitals, EKG, chest x-ray, there is no evidence for acute new abdominal processes, no evidence for decompensated heart failure, ACS, pneumonia, PE. Given the patient's history of a hernia, he was provided referral to our general surgical colleagues, as well as primary care.   Final Clinical Impressions(s) / ED Diagnoses   Final diagnoses:  Right upper quadrant abdominal pain     Gerhard Munch, MD 10/05/16 1302

## 2016-10-05 NOTE — ED Triage Notes (Signed)
Onset 2 days ago RUQ abdominal pain continued today.

## 2016-10-05 NOTE — Discharge Instructions (Signed)
As discussed, today's evaluation has been largely reassuring. However, with your upper abdominal pain, and he reported history of a hernia, it is important that you follow-up with both your primary care physician and our surgery colleagues.  Return here for concerning changes in your condition.

## 2016-10-05 NOTE — ED Notes (Signed)
Family at bedside. 

## 2016-10-05 NOTE — ED Notes (Signed)
Patient transported to X-ray 

## 2016-10-05 NOTE — ED Notes (Signed)
Pt states he understands instructions. Home stable with steady gait. 

## 2019-05-17 DEATH — deceased
